# Patient Record
Sex: Male | Born: 1950 | Race: White | Hispanic: No | Marital: Married | Smoking: Never smoker
Health system: Southern US, Community
[De-identification: ages and names within clinical notes are randomized; demographics above are authoritative.]

## PROBLEM LIST (undated history)

## (undated) DIAGNOSIS — E119 Type 2 diabetes mellitus without complications: Secondary | ICD-10-CM

## (undated) DIAGNOSIS — I10 Essential (primary) hypertension: Secondary | ICD-10-CM

## (undated) DIAGNOSIS — E785 Hyperlipidemia, unspecified: Secondary | ICD-10-CM

---

## 2007-05-29 ENCOUNTER — Emergency Department: Payer: Self-pay | Admitting: Emergency Medicine

## 2007-10-11 ENCOUNTER — Ambulatory Visit: Payer: Self-pay | Admitting: Unknown Physician Specialty

## 2008-06-28 DIAGNOSIS — C4492 Squamous cell carcinoma of skin, unspecified: Secondary | ICD-10-CM

## 2008-06-28 DIAGNOSIS — D239 Other benign neoplasm of skin, unspecified: Secondary | ICD-10-CM

## 2008-06-28 HISTORY — DX: Other benign neoplasm of skin, unspecified: D23.9

## 2008-06-28 HISTORY — DX: Squamous cell carcinoma of skin, unspecified: C44.92

## 2020-08-20 ENCOUNTER — Other Ambulatory Visit: Payer: Self-pay | Admitting: Sports Medicine

## 2020-08-20 DIAGNOSIS — M5136 Other intervertebral disc degeneration, lumbar region: Secondary | ICD-10-CM

## 2020-08-20 DIAGNOSIS — G8929 Other chronic pain: Secondary | ICD-10-CM

## 2020-08-20 DIAGNOSIS — M5416 Radiculopathy, lumbar region: Secondary | ICD-10-CM

## 2020-08-20 DIAGNOSIS — M47816 Spondylosis without myelopathy or radiculopathy, lumbar region: Secondary | ICD-10-CM

## 2020-08-20 DIAGNOSIS — M419 Scoliosis, unspecified: Secondary | ICD-10-CM

## 2020-08-20 DIAGNOSIS — R29898 Other symptoms and signs involving the musculoskeletal system: Secondary | ICD-10-CM

## 2020-09-09 ENCOUNTER — Ambulatory Visit
Admission: RE | Admit: 2020-09-09 | Discharge: 2020-09-09 | Disposition: A | Discharge status: Home / Self Care | Payer: Medicare HMO | Source: Ambulatory Visit | Attending: Sports Medicine | Admitting: Sports Medicine

## 2020-09-09 ENCOUNTER — Other Ambulatory Visit: Payer: Self-pay

## 2020-09-09 DIAGNOSIS — R29898 Other symptoms and signs involving the musculoskeletal system: Secondary | ICD-10-CM

## 2020-09-09 DIAGNOSIS — M5416 Radiculopathy, lumbar region: Secondary | ICD-10-CM

## 2020-09-09 DIAGNOSIS — M5136 Other intervertebral disc degeneration, lumbar region: Secondary | ICD-10-CM

## 2020-09-09 DIAGNOSIS — M419 Scoliosis, unspecified: Secondary | ICD-10-CM

## 2020-09-09 DIAGNOSIS — M47816 Spondylosis without myelopathy or radiculopathy, lumbar region: Secondary | ICD-10-CM

## 2020-09-09 DIAGNOSIS — G8929 Other chronic pain: Secondary | ICD-10-CM

## 2022-04-22 IMAGING — MR MR LUMBAR SPINE W/O CM
4 of 5 series · 26 of 48 positions shown · non-contrast
Comparison: None

CLINICAL DATA: Low back pain into the left lower extremity to the
foot. Decreased range of motion in the left lower extremity.

EXAM:
MRI LUMBAR SPINE WITHOUT CONTRAST
TECHNIQUE: Multiplanar, multisequence MR imaging of the lumbar spine was
performed. No intravenous contrast was administered.

[Series 2: T2 · sagittal · 4.0mm · 1.09mm/px · 6 of 17 slices shown (1 of 2)]
[im 1/17]
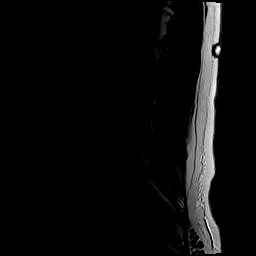
[im 4/17]
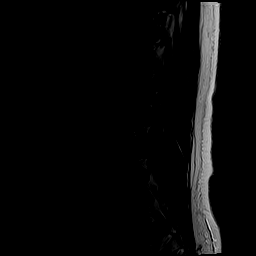
[im 7/17]
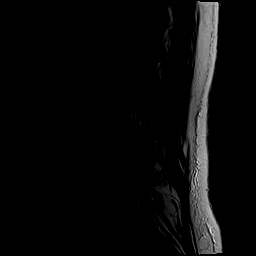
[im 10/17]
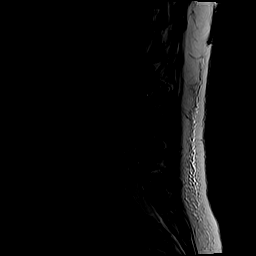
[im 13/17]
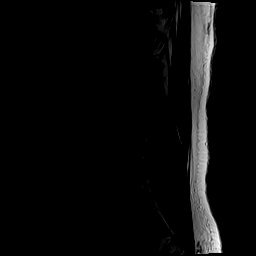
[im 17/17]
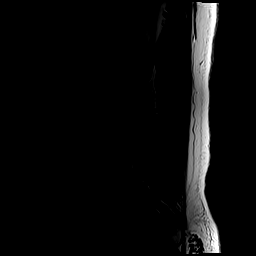

[Series 4: T1 · sagittal · 4.0mm · 1.09mm/px · 6 of 17 slices shown (1 of 2)]
[im 1/17]
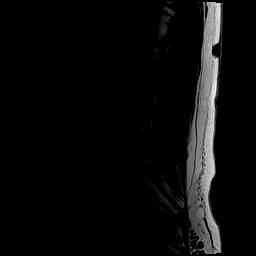
[im 4/17]
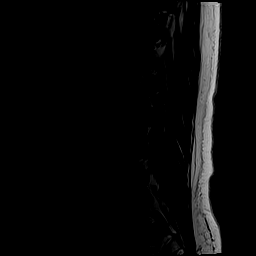
[im 7/17]
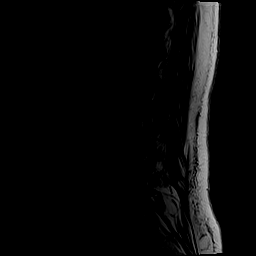
[im 10/17]
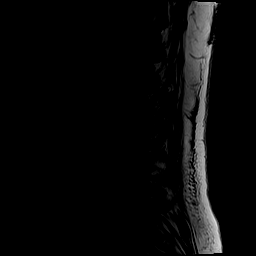
[im 13/17]
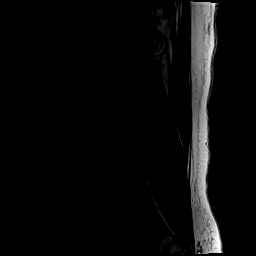
[im 17/17]
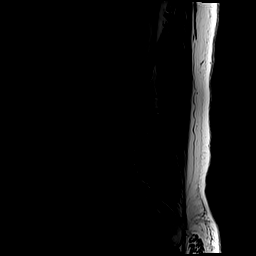

[Series 5: T2 · axial · 4.0mm · 0.39mm/px · z∈[-132,+94]mm · 9 of 42 slices shown (2 of 2)]
[im 1/42]
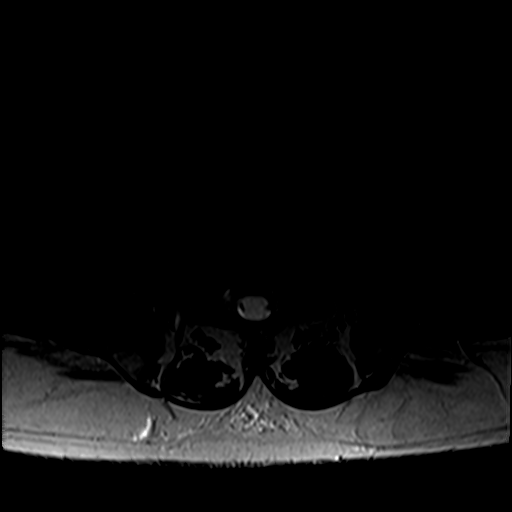
[im 6/42]
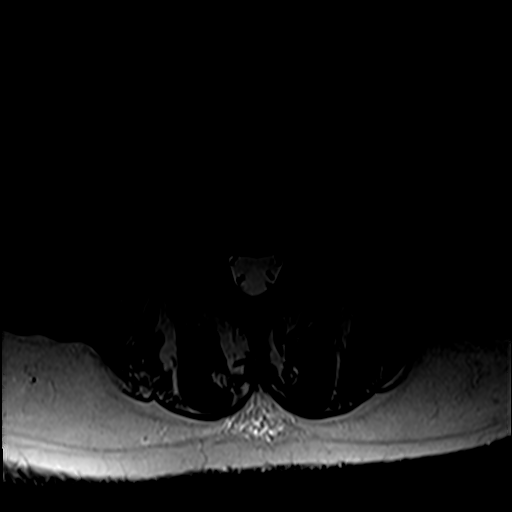
[im 12/42]
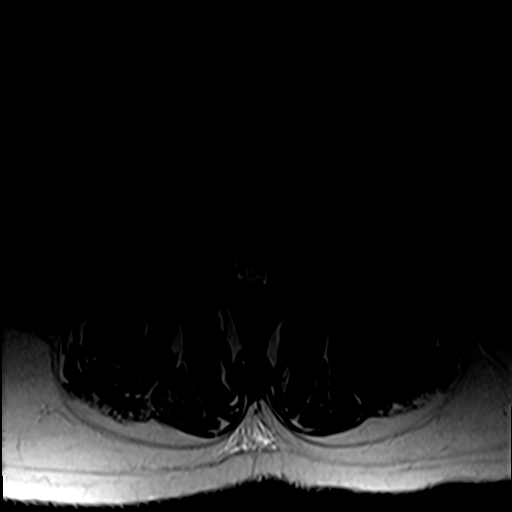
[im 18/42]
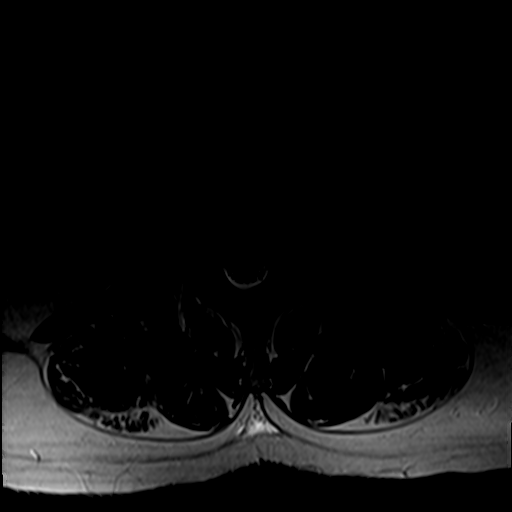
[im 21/42]
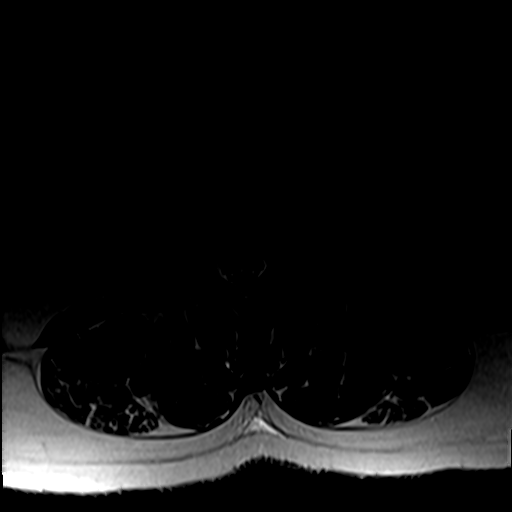
[im 24/42]
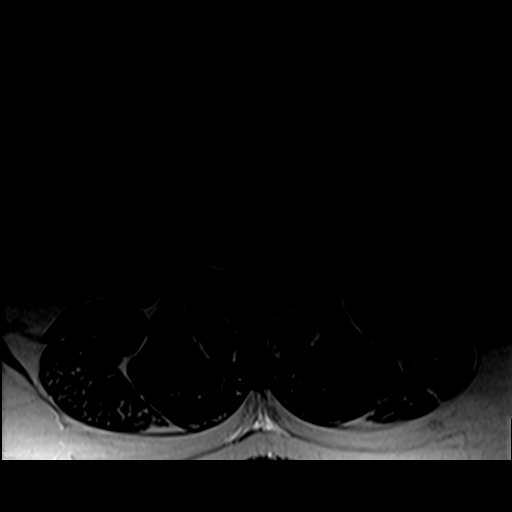
[im 30/42]
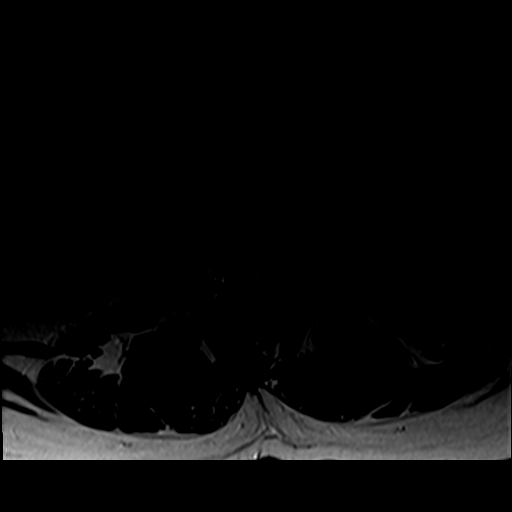
[im 36/42]
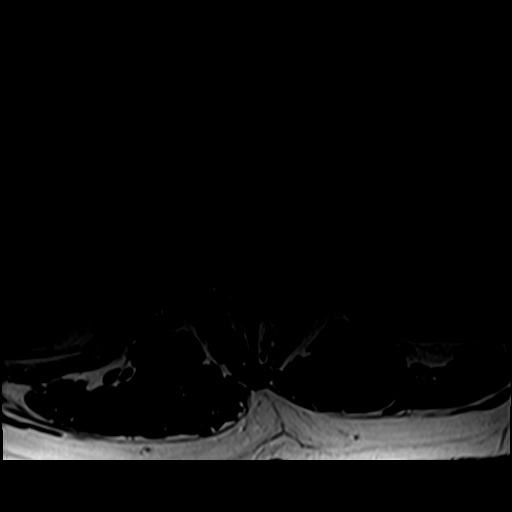
[im 42/42]
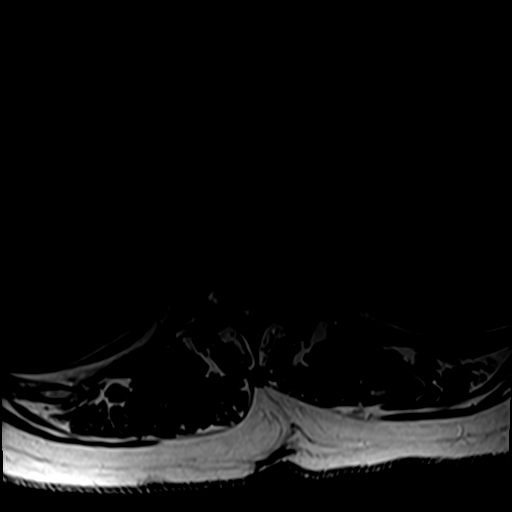

[Series 6: T1 · axial · 4.0mm · 0.39mm/px · z∈[-132,+65]mm · 5 of 42 slices shown (2 of 2)]
[im 1/42]
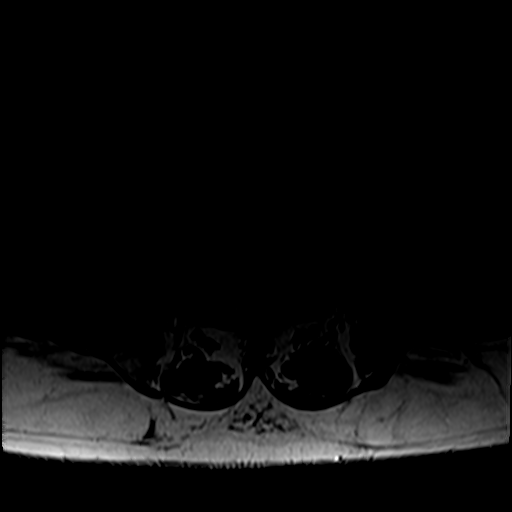
[im 6/42]
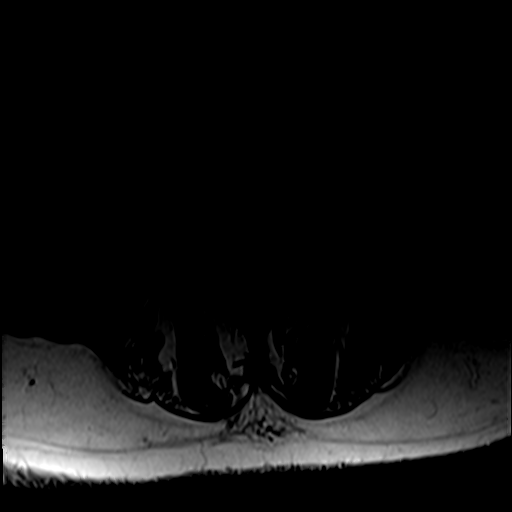
[im 12/42]
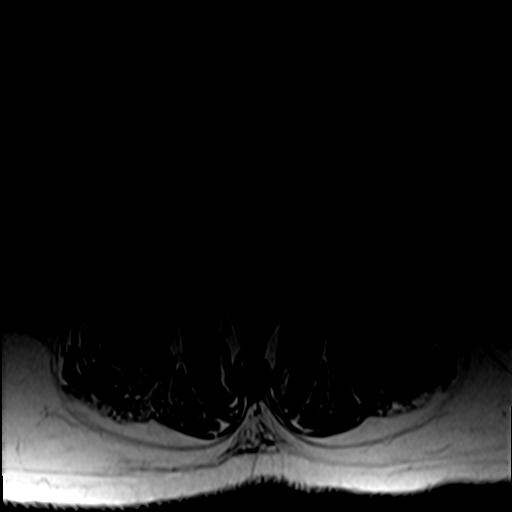
[im 21/42]
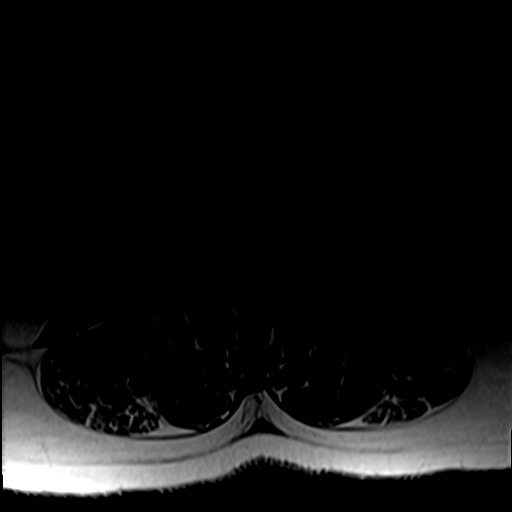
[im 36/42]
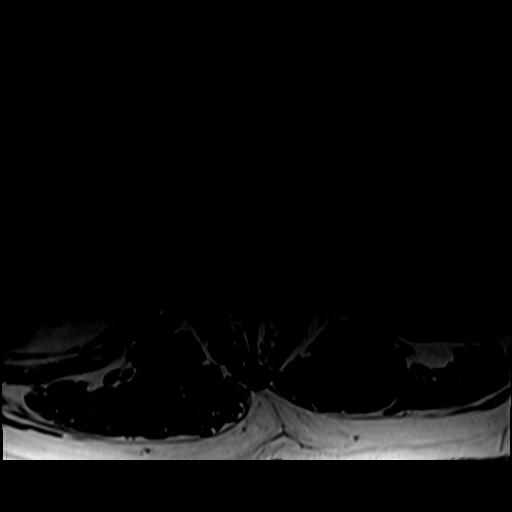

[26 of 48 positions shown; findings below may reference images not displayed]

FINDINGS: Segmentation: 5 non rib-bearing lumbar type vertebral bodies are
present. The lowest fully formed vertebral body is L5.

Alignment: No significant listhesis is present. There is mild
straightening of the normal lumbar lordosis. S shaped curvature of
the lumbar spine is centered to the right at L2-3 and to the left at
L4-5.

Vertebrae: Vertebral body heights are maintained. Chronic fatty
endplate marrow changes are present on the right at L5-S1 and
posteriorly at L3-4 and L4-5.

Conus medullaris and cauda equina: Conus extends to the L1-2 level.
Conus and cauda equina appear normal.

Paraspinal and other soft tissues: Limited imaging the abdomen is
unremarkable. There is no significant adenopathy. No solid organ
lesions are present. Focal subcutaneous lesion to the right of
midline at the L1-2 level likely represents a sebaceous cyst
measuring up to 18 mm.

Disc levels:

T12-L1: Negative.

L1-2: Mild disc bulging is present without significant stenosis.

L2-3: Mild disc bulging is asymmetric to the right. Mild bilateral
facet hypertrophy is present. No significant stenosis is present.

L3-4: A broad-based disc protrusion is present. Mild facet
hypertrophy is noted bilaterally. This results in moderate left and
mild right foraminal narrowing. Mild left subarticular narrowing is
present.

L4-5: A broad-based disc protrusion is present. Moderate facet
hypertrophy is worse on the right. The central canal is patent.
Severe right and moderate left foraminal stenosis is present.

L5-S1: A rightward disc protrusion is present. Disc extends into the
foramen without significant stenosis. Facet hypertrophy is worse on
the right.
IMPRESSION: 1. Most significant left-sided disease is at L3-4 with moderate left
and mild right foraminal stenosis secondary to a broad-based disc
protrusion and facet hypertrophy.
2. Mild left subarticular narrowing at L3-4.
3. Severe right and moderate left foraminal stenosis at L4-5
secondary to a broad-based disc protrusion and moderate facet
hypertrophy.
4. Rightward disc protrusion at L5-S1 without significant stenosis.
5. Mild disc bulging and facet hypertrophy at L1-2 and L2-3 without
significant stenosis.

## 2022-09-13 DIAGNOSIS — I700 Atherosclerosis of aorta: Secondary | ICD-10-CM | POA: Insufficient documentation

## 2022-09-23 ENCOUNTER — Other Ambulatory Visit: Payer: Self-pay

## 2022-09-23 ENCOUNTER — Observation Stay (HOSPITAL_COMMUNITY)
Admission: EM | Admit: 2022-09-23 | Discharge: 2022-09-24 | Disposition: A | Discharge status: Home / Self Care | Payer: Medicare HMO | Attending: Internal Medicine | Admitting: Internal Medicine

## 2022-09-23 ENCOUNTER — Emergency Department (HOSPITAL_COMMUNITY): Payer: Medicare HMO

## 2022-09-23 ENCOUNTER — Encounter (HOSPITAL_COMMUNITY): Payer: Self-pay

## 2022-09-23 DIAGNOSIS — R5383 Other fatigue: Secondary | ICD-10-CM | POA: Diagnosis not present

## 2022-09-23 DIAGNOSIS — R4182 Altered mental status, unspecified: Secondary | ICD-10-CM | POA: Diagnosis not present

## 2022-09-23 DIAGNOSIS — R471 Dysarthria and anarthria: Secondary | ICD-10-CM

## 2022-09-23 DIAGNOSIS — I959 Hypotension, unspecified: Secondary | ICD-10-CM | POA: Diagnosis not present

## 2022-09-23 DIAGNOSIS — R0902 Hypoxemia: Secondary | ICD-10-CM | POA: Insufficient documentation

## 2022-09-23 DIAGNOSIS — R4781 Slurred speech: Secondary | ICD-10-CM | POA: Insufficient documentation

## 2022-09-23 DIAGNOSIS — Z7984 Long term (current) use of oral hypoglycemic drugs: Secondary | ICD-10-CM | POA: Diagnosis not present

## 2022-09-23 DIAGNOSIS — E669 Obesity, unspecified: Secondary | ICD-10-CM | POA: Insufficient documentation

## 2022-09-23 DIAGNOSIS — R944 Abnormal results of kidney function studies: Secondary | ICD-10-CM | POA: Insufficient documentation

## 2022-09-23 DIAGNOSIS — R42 Dizziness and giddiness: Secondary | ICD-10-CM | POA: Diagnosis present

## 2022-09-23 DIAGNOSIS — R410 Disorientation, unspecified: Secondary | ICD-10-CM | POA: Insufficient documentation

## 2022-09-23 DIAGNOSIS — R61 Generalized hyperhidrosis: Secondary | ICD-10-CM | POA: Insufficient documentation

## 2022-09-23 DIAGNOSIS — Z1152 Encounter for screening for COVID-19: Secondary | ICD-10-CM | POA: Insufficient documentation

## 2022-09-23 DIAGNOSIS — R110 Nausea: Secondary | ICD-10-CM | POA: Diagnosis not present

## 2022-09-23 DIAGNOSIS — Z79899 Other long term (current) drug therapy: Secondary | ICD-10-CM | POA: Insufficient documentation

## 2022-09-23 DIAGNOSIS — I10 Essential (primary) hypertension: Secondary | ICD-10-CM | POA: Insufficient documentation

## 2022-09-23 DIAGNOSIS — R7989 Other specified abnormal findings of blood chemistry: Secondary | ICD-10-CM | POA: Diagnosis not present

## 2022-09-23 DIAGNOSIS — E785 Hyperlipidemia, unspecified: Secondary | ICD-10-CM | POA: Insufficient documentation

## 2022-09-23 DIAGNOSIS — E119 Type 2 diabetes mellitus without complications: Secondary | ICD-10-CM | POA: Diagnosis not present

## 2022-09-23 DIAGNOSIS — IMO0002 Reserved for concepts with insufficient information to code with codable children: Secondary | ICD-10-CM | POA: Insufficient documentation

## 2022-09-23 DIAGNOSIS — Z6836 Body mass index (BMI) 36.0-36.9, adult: Secondary | ICD-10-CM | POA: Diagnosis not present

## 2022-09-23 HISTORY — DX: Essential (primary) hypertension: I10

## 2022-09-23 HISTORY — DX: Hyperlipidemia, unspecified: E78.5

## 2022-09-23 HISTORY — DX: Type 2 diabetes mellitus without complications: E11.9

## 2022-09-23 LAB — DIFFERENTIAL
Abs Immature Granulocytes: 0.02 10*3/uL (ref 0.00–0.07)
Basophils Absolute: 0.1 10*3/uL (ref 0.0–0.1)
Basophils Relative: 1 %
Eosinophils Absolute: 0.2 10*3/uL (ref 0.0–0.5)
Eosinophils Relative: 3 %
Immature Granulocytes: 0 %
Lymphocytes Relative: 14 %
Lymphs Abs: 1 10*3/uL (ref 0.7–4.0)
Monocytes Absolute: 0.4 10*3/uL (ref 0.1–1.0)
Monocytes Relative: 6 %
Neutro Abs: 5.5 10*3/uL (ref 1.7–7.7)
Neutrophils Relative %: 76 %

## 2022-09-23 LAB — COMPREHENSIVE METABOLIC PANEL
ALT: 47 U/L — ABNORMAL HIGH (ref 0–44)
AST: 35 U/L (ref 15–41)
Albumin: 3.9 g/dL (ref 3.5–5.0)
Alkaline Phosphatase: 25 U/L — ABNORMAL LOW (ref 38–126)
Anion gap: 13 (ref 5–15)
BUN: 22 mg/dL (ref 8–23)
CO2: 24 mmol/L (ref 22–32)
Calcium: 9.4 mg/dL (ref 8.9–10.3)
Chloride: 103 mmol/L (ref 98–111)
Creatinine, Ser: 1.26 mg/dL — ABNORMAL HIGH (ref 0.61–1.24)
GFR, Estimated: >60 mL/min (ref >60)
Glucose, Bld: 198 mg/dL — ABNORMAL HIGH (ref 70–99)
Potassium: 3.7 mmol/L (ref 3.5–5.1)
Sodium: 140 mmol/L (ref 135–145)
Total Bilirubin: 0.4 mg/dL (ref 0.3–1.2)
Total Protein: 6.2 g/dL — ABNORMAL LOW (ref 6.5–8.1)

## 2022-09-23 LAB — CBC
HCT: 38.4 % — ABNORMAL LOW (ref 39.0–52.0)
Hemoglobin: 12.8 g/dL — ABNORMAL LOW (ref 13.0–17.0)
MCH: 30 pg (ref 26.0–34.0)
MCHC: 33.3 g/dL (ref 30.0–36.0)
MCV: 89.9 fL (ref 80.0–100.0)
Platelets: 281 10*3/uL (ref 150–400)
RBC: 4.27 MIL/uL (ref 4.22–5.81)
RDW: 13.6 % (ref 11.5–15.5)
WBC: 7.3 10*3/uL (ref 4.0–10.5)
nRBC: 0 % (ref 0.0–0.2)

## 2022-09-23 LAB — URINALYSIS, ROUTINE W REFLEX MICROSCOPIC
Bilirubin Urine: NEGATIVE
Glucose, UA: NEGATIVE mg/dL
Hgb urine dipstick: NEGATIVE
Ketones, ur: NEGATIVE mg/dL
Leukocytes,Ua: NEGATIVE
Nitrite: NEGATIVE
Protein, ur: NEGATIVE mg/dL
Specific Gravity, Urine: 1.011 (ref 1.005–1.030)
pH: 6 (ref 5.0–8.0)

## 2022-09-23 LAB — I-STAT CHEM 8, ED
BUN: 23 mg/dL (ref 8–23)
Calcium, Ion: 1.19 mmol/L (ref 1.15–1.40)
Chloride: 103 mmol/L (ref 98–111)
Creatinine, Ser: 1.2 mg/dL (ref 0.61–1.24)
Glucose, Bld: 196 mg/dL — ABNORMAL HIGH (ref 70–99)
HCT: 38 % — ABNORMAL LOW (ref 39.0–52.0)
Hemoglobin: 12.9 g/dL — ABNORMAL LOW (ref 13.0–17.0)
Potassium: 3.7 mmol/L (ref 3.5–5.1)
Sodium: 141 mmol/L (ref 135–145)
TCO2: 28 mmol/L (ref 22–32)

## 2022-09-23 LAB — APTT: aPTT: 23 seconds — ABNORMAL LOW (ref 24–36)

## 2022-09-23 LAB — CORTISOL: Cortisol, Plasma: 11.8 ug/dL

## 2022-09-23 LAB — TROPONIN I (HIGH SENSITIVITY)
Troponin I (High Sensitivity): 4 ng/L (ref <18)
Troponin I (High Sensitivity): 4 ng/L (ref <18)

## 2022-09-23 LAB — ETHANOL: Alcohol, Ethyl (B): <10 mg/dL (ref <10)

## 2022-09-23 LAB — RAPID URINE DRUG SCREEN, HOSP PERFORMED
Amphetamines: NOT DETECTED
Barbiturates: NOT DETECTED
Benzodiazepines: NOT DETECTED
Cocaine: NOT DETECTED
Opiates: NOT DETECTED
Tetrahydrocannabinol: POSITIVE — AB

## 2022-09-23 LAB — PROCALCITONIN: Procalcitonin: <0.1 ng/mL

## 2022-09-23 LAB — MAGNESIUM: Magnesium: 1.8 mg/dL (ref 1.7–2.4)

## 2022-09-23 LAB — GLUCOSE, CAPILLARY: Glucose-Capillary: 116 mg/dL — ABNORMAL HIGH (ref 70–99)

## 2022-09-23 LAB — RESP PANEL BY RT-PCR (RSV, FLU A&B, COVID)  RVPGX2
Influenza A by PCR: NEGATIVE
Influenza B by PCR: NEGATIVE
Resp Syncytial Virus by PCR: NEGATIVE
SARS Coronavirus 2 by RT PCR: NEGATIVE

## 2022-09-23 LAB — LACTIC ACID, PLASMA
Lactic Acid, Venous: 4.2 mmol/L (ref 0.5–1.9)
Lactic Acid, Venous: 2.1 mmol/L (ref 0.5–1.9)

## 2022-09-23 LAB — PROTIME-INR
INR: 1.1 (ref 0.8–1.2)
Prothrombin Time: 14.5 seconds (ref 11.4–15.2)

## 2022-09-23 LAB — CBG MONITORING, ED: Glucose-Capillary: 180 mg/dL — ABNORMAL HIGH (ref 70–99)

## 2022-09-23 LAB — BRAIN NATRIURETIC PEPTIDE: B Natriuretic Peptide: 31.4 pg/mL (ref 0.0–100.0)

## 2022-09-23 MED ORDER — LACTATED RINGERS IV BOLUS (SEPSIS)
50.0000 mL | Freq: Once | INTRAVENOUS | Status: AC
  Administered 2022-09-23: 50 mL via INTRAVENOUS

## 2022-09-23 MED ORDER — ACETAMINOPHEN 325 MG PO TABS: 650.0000 mg | ORAL_TABLET | Freq: Four times a day (QID) | ORAL | Status: DC | PRN

## 2022-09-23 MED ORDER — SODIUM CHLORIDE 0.9% FLUSH
3.0000 mL | Freq: Once | INTRAVENOUS | Status: AC
  Administered 2022-09-23: 3 mL via INTRAVENOUS

## 2022-09-23 MED ORDER — VANCOMYCIN HCL 2000 MG/400ML IV SOLN
2000.0000 mg | Freq: Once | INTRAVENOUS | Status: AC
  Administered 2022-09-23: 2000 mg via INTRAVENOUS
  Filled 2022-09-23: qty 400

## 2022-09-23 MED ORDER — METRONIDAZOLE 500 MG/100ML IV SOLN
500.0000 mg | Freq: Once | INTRAVENOUS | Status: AC
  Administered 2022-09-23: 500 mg via INTRAVENOUS
  Filled 2022-09-23: qty 100

## 2022-09-23 MED ORDER — SODIUM CHLORIDE 0.9% FLUSH
3.0000 mL | Freq: Two times a day (BID) | INTRAVENOUS | Status: DC
  Administered 2022-09-23 – 2022-09-24 (×2): 3 mL via INTRAVENOUS

## 2022-09-23 MED ORDER — LACTATED RINGERS IV BOLUS
1000.0000 mL | Freq: Once | INTRAVENOUS | Status: AC
  Administered 2022-09-23: 1000 mL via INTRAVENOUS

## 2022-09-23 MED ORDER — VANCOMYCIN HCL 1500 MG/300ML IV SOLN: 1500.0000 mg | INTRAVENOUS | Status: DC

## 2022-09-23 MED ORDER — SODIUM CHLORIDE 0.9 % IV SOLN: 2.0000 g | Freq: Three times a day (TID) | INTRAVENOUS | Status: DC

## 2022-09-23 MED ORDER — POLYETHYLENE GLYCOL 3350 17 G PO PACK: 17.0000 g | PACK | Freq: Every day | ORAL | Status: DC | PRN

## 2022-09-23 MED ORDER — INSULIN ASPART 100 UNIT/ML IJ SOLN
0.0000 [IU] | Freq: Three times a day (TID) | INTRAMUSCULAR | Status: DC
  Administered 2022-09-24: 2 [IU] via SUBCUTANEOUS

## 2022-09-23 MED ORDER — PANTOPRAZOLE SODIUM 40 MG PO TBEC
40.0000 mg | DELAYED_RELEASE_TABLET | Freq: Every day | ORAL | Status: DC
  Administered 2022-09-24: 40 mg via ORAL
  Filled 2022-09-23: qty 1

## 2022-09-23 MED ORDER — SODIUM CHLORIDE 0.9 % IV SOLN
2.0000 g | Freq: Once | INTRAVENOUS | Status: AC
  Administered 2022-09-23: 2 g via INTRAVENOUS
  Filled 2022-09-23: qty 12.5

## 2022-09-23 MED ORDER — ENOXAPARIN SODIUM 60 MG/0.6ML IJ SOSY
0.5000 mg/kg | PREFILLED_SYRINGE | INTRAMUSCULAR | Status: DC
  Administered 2022-09-23: 60 mg via SUBCUTANEOUS
  Filled 2022-09-23: qty 0.6

## 2022-09-23 MED ORDER — LACTATED RINGERS IV SOLN: INTRAVENOUS | Status: AC

## 2022-09-23 MED ORDER — ACETAMINOPHEN 650 MG RE SUPP: 650.0000 mg | Freq: Four times a day (QID) | RECTAL | Status: DC | PRN

## 2022-09-23 MED ORDER — ONDANSETRON HCL 4 MG/2ML IJ SOLN
4.0000 mg | Freq: Once | INTRAMUSCULAR | Status: AC
  Administered 2022-09-23: 4 mg via INTRAVENOUS
  Filled 2022-09-23: qty 2

## 2022-09-23 MED ORDER — METRONIDAZOLE 500 MG/100ML IV SOLN: 500.0000 mg | Freq: Two times a day (BID) | INTRAVENOUS | Status: DC

## 2022-09-23 MED ORDER — VANCOMYCIN HCL IN DEXTROSE 1-5 GM/200ML-% IV SOLN: 1000.0000 mg | Freq: Once | INTRAVENOUS | Status: DC

## 2022-09-23 NOTE — Sepsis Progress Note (Signed)
eLink is following this Code Sepsis. °

## 2022-09-23 NOTE — Consult Note (Addendum)
NEURO HOSPITALIST CONSULT NOTE   Requestig physician: Dr. Jeanell Sparrow  Reason for Consult: Acute onset of confusion and slurred speech  History obtained from:  EMS and Chart     HPI:                                                                                                                                          Peter Palmer is an 72 y.o. male with a PMHx of DM, HTN and morbid obesity who presents from work via EMS as a Code Stroke. LKN was 0645 when he got up to go to work. After he drove there, he was seen by coworkers to have a staggering gait. He went inside to the breakroom and sat in a chair. EMS was called and on arrival he was confused and somnolent to lethargic with slurred speech. There was no obvious weakness or facial droop per EMS. His confusion was to the extent that he was unable to unlock his cellphone to call his wife. Vitals per EMS: Initial BP 106/45 followed by SBP of 54 on subsequent reading. RR dropped to 10. Was satting at 86% initially, which improved to 94% on 3L. CBG 209. Rhythm strip showed NSR.   Of note, coworkers saw him vomit once prior to EMS arrival. While EMS was moving him to the ambulance, he had two more episodes of emesis. He was then administered IV Zofran.   On arrival to the ED he is somnolent and diffusely weak, with dysarthric and hypophonic speech. He denies being on any blood thinners. He states that he has no history of stroke or MI. He denies any current headache, CP or SOB. He does endorse a sensation of dizziness described also as being lightheaded.   PMHx HTN DM   No family history on file.           Social History:  has no history on file for tobacco use, alcohol use, and drug use.  Not on File  MEDICATIONS:                                                                                                                     No medications on file States that he does take meds for his HTN and DM  ROS:  As per HPI. Detailed ROS deferred in the context of acuity of presentation.   Wt 118.6 kg    General Examination:                                                                                                       Physical Exam  HEENT-  [place]/AT    Lungs- Respirations unlabored Extremities- No edema  Neurological Examination Mental Status: Somnolent with relatively good attention in that context. Fully oriented to city, state, day, year and month. Speech is dysarthric and sparse, but fluent. Naming intact. Able to follow all commands.  Cranial Nerves: II: Visual fields intact bilaterally.  PERRL.   III,IV, VI: EOMI. No nystagmus.  V: Temp sensation equal bilaterally VII: Smile symmetric VIII: Hearing intact to voice.  IX,X: No hypophonia or hoarseness XI: Symmetric shoulder shrug XII: Midline tongue extension Motor: BUE 5/5 with easy fatiguability BLE 5/5 with easy fatiguability.  Tone and bulk are normal x 4 Sensory: Light touch intact throughout, bilaterally. No extinction to DSS. Deep Tendon Reflexes: 1+ and symmetric throughout Plantars: Mute bilaterally  Cerebellar: No ataxia with FNF bilaterally, but with action tremor symmetrically.  Gait: Unable to assess   NIHSS: 1  Lab Results: Basic Metabolic Panel: No results for input(s): "NA", "K", "CL", "CO2", "GLUCOSE", "BUN", "CREATININE", "CALCIUM", "MG", "PHOS" in the last 168 hours.  CBC: No results for input(s): "WBC", "NEUTROABS", "HGB", "HCT", "MCV", "PLT" in the last 168 hours.  Cardiac Enzymes: No results for input(s): "CKTOTAL", "CKMB", "CKMBINDEX", "TROPONINI" in the last 168 hours.  Lipid Panel: No results for input(s): "CHOL", "TRIG", "HDL", "CHOLHDL", "VLDL", "LDLCALC" in the last 168 hours.  Imaging: No results found.   Assessment: 72 year old male presenting via EMS  with acute onset of  confusion and slurred speech - Exam reveals mild dysarthria in the context of diffuse fatiguability and somnolence.  - NIHSS 1 - CT head: No acute abnormality. Chronic right BG lacunar infarction is noted. - Overall presentation does not militate in favor of stroke. Will need ED work up to determine the etiology for his hypotension, decreased O2 saturations and vomiting.   Recommendations: - Will need ED work up to determine the etiology for his hypotension, decreased O2 saturations and vomiting.  - MRI brain   Electronically signed: Dr. Kerney Elbe 09/23/2022, 9:59 AM

## 2022-09-23 NOTE — Code Documentation (Signed)
Peter Palmer is a 72 yr old male arriving to [facility] via EMS on 09/23/2022 with a PMH of DM HTN and obesity. He is not on any blood thinners. Pt is from work where he was last known well at 0645 walking into work. He is now c/o AMS and dysarthria.    Stroke team at bridge for on pt's arrival. Labs, CBG obtained, airway cleared by EDP. Pt to CT with team. NIHSS 2, for drowsy, and dysarthric. The following imaging was completed: CT head. Per Dr. Cheral Marker, CT is negative for ICH.     Pt to ED room 23 where his workup will continue. Pt hypotensive, and vomiting. Fluids and antiemetic given by EDRN. Pt not a candidate for TNK as low suspicion for stroke. Pt ineligible for mechanical thrombectomy due to no LVO symptoms. Handoff with ED RN D[place] Ar.

## 2022-09-23 NOTE — ED Triage Notes (Signed)
Pt brought in by EMS from work after being called d/t lethargy/not himself/slurred speech. Pt LKN was 0645. Upon EMS arrival pt was hypotensive to 54 systolic, HR [id number], and mildly hypoxic (88%) so placed on 2L [place]. Pt still exhibiting slurred speech and drowsy as well as endorsing dizziness.

## 2022-09-23 NOTE — ED Provider Notes (Signed)
[facility] Provider Note   CSN: [id number] Arrival date & time: 09/23/22  [id number]  An emergency department physician performed an initial assessment on this suspected stroke patient at 91.  History {Add pertinent medical, surgical, social history, OB history to HPI:1} Chief Complaint  Patient presents with  . Code Stroke    Peter Palmer is a 72 y.o. male.  HPI 72 yo male with history of hypertension,hyperlipidemia presents today as code stroke. Patient when he went to work at 645.  Somewhere around 8:00 patient was found walking in confused and slurred speech complaining of lightheadedness.  Patient was hypotensive on EMS arrival systolic blood pressure of 54 heart rate in the 70s to 80s and mildly hypoxic 88%.  He was seen at the [place] code stroke.   He denies fever, chills  Home Medications Prior to Admission medications   Medication Sig Start Date End Date Taking? Authorizing Provider  amLODipine (NORVASC) 10 MG tablet Take 10 mg by mouth daily.   Yes [provider]  Cholecalciferol (VITAMIN D-3 PO) Take 1 capsule by mouth daily.   Yes [provider]  FENOFIBRATE PO Take 1 tablet by mouth at bedtime.   Yes [provider]  gabapentin (NEURONTIN) 300 MG capsule Take 900 mg by mouth at bedtime.   Yes [provider]  metFORMIN HCl (GLUCOPHAGE PO) Take 1 tablet by mouth 2 (two) times daily.   Yes [provider]  MILK THISTLE PO Take 2 tablets by mouth at bedtime.   Yes [provider]  Misc Natural Products (TURMERIC CURCUMIN) CAPS Take 1-2 capsules by mouth See admin instructions. 2 capsules every morning, 1 capsule at bedtime.   Yes [provider]  Omega-3 Fatty Acids (FISH OIL PO) Take 2 capsules by mouth daily.   Yes [provider]  omeprazole (PRILOSEC) 20 MG capsule Take 20 mg by mouth daily.   Yes [provider]  OVER THE COUNTER MEDICATION Take  2 capsules by mouth daily. Beet root supplement   Yes [provider]      Allergies    Lipitor [atorvastatin]    Review of Systems   Review of Systems  Physical Exam Updated Vital Signs BP (!) 140/78   Pulse 70   Temp (!) 96.8 F (36 C)   Resp 17   Ht 1.753 m ('5\' 9"'$ )   Wt 118.6 kg   SpO2 (!) 87% Comment: will place back on nasal cannula  BMI 38.61 kg/m  Physical Exam Vitals and nursing note reviewed.  Constitutional:      General: He is not in acute distress.    Appearance: He is ill-appearing.  HENT:     Head: Normocephalic.     Right Ear: External ear normal.     Left Ear: External ear normal.     Nose: Nose normal.     Mouth/Throat:     Mouth: Mucous membranes are dry.  Eyes:     Extraocular Movements: Extraocular movements intact.     Pupils: Pupils are equal, round, and reactive to light.  Cardiovascular:     Rate and Rhythm: Normal rate and regular rhythm.     Pulses: Normal pulses.  Pulmonary:     Effort: Pulmonary effort is normal.     Breath sounds: Normal breath sounds.  Abdominal:     General: Abdomen is flat. Bowel sounds are normal.     Palpations: Abdomen is soft.  Musculoskeletal:  General: Normal range of motion.     Cervical back: Normal range of motion.  Skin:    General: Skin is warm and dry.     Capillary Refill: Capillary refill takes less than 2 seconds.  Neurological:     General: No focal deficit present.     Mental Status: He is alert.     Cranial Nerves: No cranial nerve deficit.     Motor: No weakness.     Gait: Gait normal.  Psychiatric:        Mood and Affect: Mood normal.    ED Results / Procedures / Treatments   Labs (all labs ordered are listed, but only abnormal results are displayed) Labs Reviewed  APTT - Abnormal; Notable for the following components:      Result Value   aPTT 23 (*)    All other components within normal limits  CBC - Abnormal; Notable for the following components:   Hemoglobin  12.8 (*)    HCT 38.4 (*)    All other components within normal limits  COMPREHENSIVE METABOLIC PANEL - Abnormal; Notable for the following components:   Glucose, Bld 198 (*)    Creatinine, Ser 1.26 (*)    Total Protein 6.2 (*)    ALT 47 (*)    Alkaline Phosphatase 25 (*)    All other components within normal limits  LACTIC ACID, PLASMA - Abnormal; Notable for the following components:   Lactic Acid, Venous 4.2 (*)    All other components within normal limits  CBG MONITORING, ED - Abnormal; Notable for the following components:   Glucose-Capillary 180 (*)    All other components within normal limits  I-STAT CHEM 8, ED - Abnormal; Notable for the following components:   Glucose, Bld 196 (*)    Hemoglobin 12.9 (*)    HCT 38.0 (*)    All other components within normal limits  RESP PANEL BY RT-PCR (RSV, FLU A&B, COVID)  RVPGX2  CULTURE, BLOOD (ROUTINE X 2)  CULTURE, BLOOD (ROUTINE X 2)  PROTIME-INR  DIFFERENTIAL  ETHANOL  RAPID URINE DRUG SCREEN, HOSP PERFORMED  LACTIC ACID, PLASMA  URINALYSIS, ROUTINE W REFLEX MICROSCOPIC  CBG MONITORING, ED    EKG EKG Interpretation  Date/Time:  Tuesday September 23 2022 10:39:30 EST Ventricular Rate:  77 PR Interval:  181 QRS Duration: 114 QT Interval:  407 QTC Calculation: 461 R Axis:   9 Text Interpretation: Sinus rhythm Borderline intraventricular conduction delay Minimal ST elevation, inferior leads No old tracing to compare Confirmed by Pattricia Boss [phone]) on 09/23/2022 11:21:06 AM  Radiology DG Chest Port 1 View  Result Date: 09/23/2022 CLINICAL DATA:  Weakness. EXAM: PORTABLE CHEST 1 VIEW COMPARISON:  None Available. FINDINGS: Low lung volumes accentuate the pulmonary vasculature and cardiomediastinal silhouette. Small left pleural effusion with adjacent left basilar atelectasis. No pneumothorax. IMPRESSION: Small left pleural effusion with adjacent left basilar atelectasis. Electronically Signed   By: Emmit Alexanders M.D.   On:  09/23/2022 11:04   CT HEAD CODE STROKE WO CONTRAST  Result Date: 09/23/2022 CLINICAL DATA:  Code stroke. Speech difficulty. Unable to coordinate movement. EXAM: CT HEAD WITHOUT CONTRAST TECHNIQUE: Contiguous axial images were obtained from the base of the skull through the vertex without intravenous contrast. RADIATION DOSE REDUCTION: This exam was performed according to the departmental dose-optimization program which includes automated exposure control, adjustment of the mA and/or kV according to patient size and/or use of iterative reconstruction technique. COMPARISON:  None Available. FINDINGS: Brain: Chronic appearing,  but technically age indeterminate infarct in the right basal ganglia. No evidence of hemorrhage, hydrocephalus, extra-axial collection or mass lesion/mass effect. Vascular: No hyperdense vessel or unexpected calcification. Skull: Normal. Negative for fracture or focal lesion. Sinuses/Orbits: No middle ear or mastoid effusion. Trace mucosal thickening right frontal sinus. Orbits are unremarkable. Other: None. ASPECTS [facility] Stroke Program Early CT Score): 9 IMPRESSION: Chronic appearing, but technically age indeterminate infarct in the right basal ganglia. No evidence of intracranial hemorrhage. Aspects is 9. Findings were paged to Dr. Cheral Marker on 09/23/22 at 10:14 AM via [facility] paging system. Electronically Signed   By: Marin Roberts M.D.   On: 09/23/2022 10:15    Procedures Procedures  {Document cardiac monitor, telemetry assessment procedure when appropriate:1}  Medications Ordered in ED Medications  lactated ringers infusion ( Intravenous New Bag/Given 09/23/22 1410)  ceFEPIme (MAXIPIME) 2 g in sodium chloride 0.9 % 100 mL IVPB (2 g Intravenous New Bag/Given 09/23/22 1411)  metroNIDAZOLE (FLAGYL) IVPB 500 mg (has no administration in time range)  lactated ringers bolus 50 mL (50 mLs Intravenous New Bag/Given 09/23/22 1408)  vancomycin (VANCOREADY) IVPB 2000 mg/400 mL (has no  administration in time range)  ceFEPIme (MAXIPIME) 2 g in sodium chloride 0.9 % 100 mL IVPB (has no administration in time range)  vancomycin (VANCOREADY) IVPB 1500 mg/300 mL (has no administration in time range)  sodium chloride flush (NS) 0.9 % injection 3 mL (3 mLs Intravenous Given 09/23/22 1019)  ondansetron (ZOFRAN) injection 4 mg (4 mg Intravenous Given 09/23/22 1019)  lactated ringers bolus 1,000 mL (0 mLs Intravenous Stopped 09/23/22 1113)  lactated ringers bolus 1,000 mL (0 mLs Intravenous Stopped 09/23/22 1236)    ED Course/ Medical Decision Making/ A&P   {   Click here for ABCD2, HEART and other calculatorsREFRESH Note before signing :1}                          Medical Decision Making 72 year old male past history of hypertension  Amount and/or Complexity of Data Reviewed Labs: ordered. Radiology: ordered.  Risk Prescription drug management.   72 year old male past medical history sniffer hypertension and type 2 diabetes presents today as code stroke.  However, he in fact was hypotensive.  Code stroke initiated prehospital and seen in conjunction with neurology.  Dr. Cheral Marker, is on-call for neurology.  He has seen and evaluated patient does not think stroke. Patient was initially borderline hypotensive.  On my reevaluation he was hypotensive and blood pressure decreased to 60 systolically.  No obvious source.  He is not having chest pain. EKG without acutely ischemic changes. Patient given 2 L of lactated ringer and present received 1 L prehospital. He has had a total of 3 L at this time and blood pressure increased to 118/54 Lactic acid is elevated at at 4.2. CBC reviewed and interpreted and within normal limits Glucose is elevated at 198 Urine drug screen reviewed and significant for [facility] Patient has clinically significantly improved. He has received broad-spectrum antibiotics here in the ED 1 weakness and hypotension Undifferentiated hypotension includes but is not limited  to infection, anemia, heart failure Patient's evaluation here most consistent with infection given generalized weakness, nonfocal findings, and elevated lactic 2 THC use 3 hyperglycemia with known diabetes {Document critical care time when appropriate:1} {Document review of labs and clinical decision tools ie heart score, Chads2Vasc2 etc:1}  {Document your independent review of radiology images, and any outside records:1} {Document your discussion with family members, caretakers,  and with consultants:1} {Document social determinants of health affecting pt's care:1} {Document your decision making why or why not admission, treatments were needed:1} Final Clinical Impression(s) / ED Diagnoses Final diagnoses:  None    Rx / DC Orders ED Discharge Orders     None

## 2022-09-23 NOTE — ED Notes (Signed)
When patient was sat up in bed and moved around to remove his shirts he became diaphoretic and nauseous, resulting in emesis. MD Ray notified and zofran ordered.

## 2022-09-23 NOTE — ED Notes (Signed)
Pt is feeling a lot better and upon learning he was positive for [facility] and going through his day again he remembered this morning at the gas station he purchased some gummies at the counter for his hip pain (among other food and beverage purchases). Pt does not normally ingest caffeine/tobacco/alcohol/drugs of any kind. Pt reports he has in fact never done marijuana or THC, that this accidental ingestion was a first for him.

## 2022-09-23 NOTE — H&P (Addendum)
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 male with medical history significant of HTN, HLD, DM presenting with AMS, ED.  Patient presented from work today and after his behavior became unusual he developed slurred speech and was acting lethargic.  Last normal was around 6:45 AM.  EMS was called and patient noted to be hypotensive with initial systolic BP around [id number], received a liter and route.  Initial O2 sats 88% improved on 2 L.  Due to his altered mentation and slurred speech on arrival he was seen as a code stroke but given hypotension neurology felt this was less likely but did recommend MRI brain to rule it out.  Patient does report taking 2 Gummies this morning from the counter where he gets his food.  Does not typically do this, told they were for pain.  Denies fevers, chills, chest pain, shortness of breath, abdominal pain, constipation, diarrhea, nausea, vomiting.  While in the ED he did develop an episode of diaphoresis and nausea.  ED Course: Vital signs in ED significant for initial blood pressure in the 70 systolic.  This has improved to the 0000000 to 0000000 systolic after IV fluids.  Lab workup included CMP with creatinine at 1.26 around baseline, glucose 198, protein 6.2, AST is 47, ALT 25.  CBC with hemoglobin of 12.8 from unknown baseline.  PT and INR normal.  PTT mildly elevated at 23.  Lactic acid initially elevated 4.2 but improved to 2.1 on repeat.  Respiratory panel for flu COVID RSV negative.  Tylenol level negative.  UDS positive for THC only.  Urinalysis normal.  Blood cultures pending.  Chest x-ray with small left pleural effusion with basilar atelectasis.  CT head showed chronic appearing but technically age-indeterminate infarct.  Patient received vancomycin, cefepime, Flagyl in the ED as well as  additional 2 L of IV fluids and started on a rate of 150 cc an hour.  Also received a dose of Zofran.  Neurology recommended MRI brain and to investigate etiology of hypertension as above.  Review of Systems: As per HPI otherwise all other systems reviewed and are negative.  Past Medical History:  Diagnosis Date   Diabetes mellitus without complication ([place])    Hyperlipemia    Hypertension     History reviewed. No pertinent surgical history.  Social History  reports that he has never smoked. He has never used smokeless tobacco. He reports that he does not drink alcohol and does not use drugs.  Allergies  Allergen Reactions   Lipitor [Atorvastatin] Other (See Comments)    Myalgias    History reviewed. No pertinent family history.  Prior to Admission medications   Medication Sig Start Date End Date Taking? Authorizing Provider  amLODipine (NORVASC) 10 MG tablet Take 10 mg by mouth daily.   Yes [provider]  Cholecalciferol (VITAMIN D-3 PO) Take 1 capsule by mouth daily.   Yes [provider]  FENOFIBRATE PO Take 1 tablet by mouth at bedtime.   Yes [provider]  gabapentin (NEURONTIN) 300 MG capsule Take 900 mg by mouth at bedtime.   Yes [provider]  metFORMIN HCl (GLUCOPHAGE PO) Take 1 tablet by mouth 2 (two) times daily.   Yes [provider]  MILK THISTLE PO Take 2 tablets by mouth at bedtime.   Yes [provider]  Misc Natural Products (TURMERIC CURCUMIN) CAPS Take 1-2 capsules by mouth See admin instructions. 2 capsules every morning, 1 capsule at bedtime.   Yes [provider]  Omega-3 Fatty Acids (FISH OIL PO) Take 2 capsules by mouth daily.   Yes [provider]  omeprazole (PRILOSEC) 20 MG capsule Take 20 mg by mouth daily.   Yes [provider]  OVER THE COUNTER MEDICATION Take 2 capsules by mouth daily. Beet root supplement   Yes [provider]    Physical Exam: Vitals:    09/23/22 1500 09/23/22 1630 09/23/22 1730 09/23/22 1754  BP: (!) 118/54 108/61 (!) 110/52   Pulse: 73 67 67   Resp: '11 19 17   '$ Temp:    (!) 97.2 F (36.2 C)  TempSrc:    Oral  SpO2: 96% 97% 96%   Weight:      Height:        Physical Exam Constitutional:      General: He is not in acute distress.    Appearance: Normal appearance. He is obese.  HENT:     Head: Normocephalic and atraumatic.     Mouth/Throat:     Mouth: Mucous membranes are moist.     Pharynx: Oropharynx is clear.  Eyes:     Extraocular Movements: Extraocular movements intact.     Pupils: Pupils are equal, round, and reactive to light.  Cardiovascular:     Rate and Rhythm: Normal rate and regular rhythm.     Pulses: Normal pulses.     Heart sounds: Normal heart sounds.  Pulmonary:     Effort: Pulmonary effort is normal. No respiratory distress.     Breath sounds: Normal breath sounds.  Abdominal:     General: Bowel sounds are normal. There is no distension.     Palpations: Abdomen is soft.     Tenderness: There is no abdominal tenderness.  Musculoskeletal:        General: No swelling or deformity.  Skin:    General: Skin is warm and dry.  Neurological:     General: No focal deficit present.     Mental Status: Mental status is at baseline.     Labs on Admission: I have personally reviewed following labs and imaging studies  CBC: Recent Labs  Lab 09/23/22 0954 09/23/22 1001  WBC 7.3  --   NEUTROABS 5.5  --   HGB 12.8* 12.9*  HCT 38.4* 38.0*  MCV 89.9  --   PLT 281  --     Basic Metabolic Panel: Recent Labs  Lab 09/23/22 0954 09/23/22 1001  NA 140 141  K 3.7 3.7  CL 103 103  CO2 24  --   GLUCOSE 198* 196*  BUN 22 23  CREATININE 1.26* 1.20  CALCIUM 9.4  --     GFR: Estimated Creatinine Clearance: 69.7 mL/min (by C-G formula based on SCr of 1.2 mg/dL).  Liver Function Tests: Recent Labs  Lab 09/23/22 0954  AST 35  ALT 47*  ALKPHOS 25*  BILITOT 0.4  PROT 6.2*  ALBUMIN  3.9    Urine analysis:    Component Value Date/Time   COLORURINE YELLOW 09/23/2022 1325   APPEARANCEUR CLEAR 09/23/2022 1325   LABSPEC 1.011 09/23/2022 1325   PHURINE 6.0 09/23/2022 1325   GLUCOSEU NEGATIVE 09/23/2022 1325   HGBUR NEGATIVE 09/23/2022 1325   BILIRUBINUR NEGATIVE 09/23/2022 1325   KETONESUR NEGATIVE 09/23/2022 1325   PROTEINUR NEGATIVE 09/23/2022 1325   NITRITE NEGATIVE 09/23/2022  [place] 09/23/2022 1325    Radiological Exams on Admission: DG Chest Port 1 View  Result Date: 09/23/2022 CLINICAL DATA:  Weakness. EXAM: PORTABLE CHEST 1 VIEW COMPARISON:  None Available. FINDINGS: Low lung volumes accentuate the pulmonary vasculature and cardiomediastinal silhouette. Small left pleural effusion with adjacent left basilar atelectasis. No pneumothorax. IMPRESSION: Small left pleural effusion with adjacent left basilar atelectasis. Electronically Signed   By: Emmit Alexanders M.D.   On: 09/23/2022 11:04   CT HEAD CODE STROKE WO CONTRAST  Result Date: 09/23/2022 CLINICAL DATA:  Code stroke. Speech difficulty. Unable to coordinate movement. EXAM: CT HEAD WITHOUT CONTRAST TECHNIQUE: Contiguous axial images were obtained from the base of the skull through the vertex without intravenous contrast. RADIATION DOSE REDUCTION: This exam was performed according to the departmental dose-optimization program which includes automated exposure control, adjustment of the mA and/or kV according to patient size and/or use of iterative reconstruction technique. COMPARISON:  None Available. 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 No evidence of hemorrhage, hydrocephalus, extra-axial collection or mass lesion/mass effect. Vascular: No hyperdense vessel or unexpected calcification. Skull: Normal. Negative for fracture or focal lesion. Sinuses/Orbits: No middle ear or mastoid effusion. Trace mucosal thickening right frontal  sinus. Orbits are unremarkable. Other: None. 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 No evidence of intracranial hemorrhage. Aspects is 9. Findings were paged to Dr. Cheral Marker on 09/23/22 at 10:14 AM via [facility] paging system. Electronically Signed   By: Marin Roberts M.D.   On: 09/23/2022 10:15    EKG: Independently reviewed.  Sinus rhythm at 79 bpm.  Nonspecific T ST changes.  No prior to compare.  Assessment/Plan Principal Problem:   Hypotension Active Problems:   Creatinine elevation   HLD (hyperlipidemia)   HTN (hypertension)   DM (diabetes mellitus) ([place])   Hypotension Rule out sepsis > Patient presenting with altered mental status and hypotension.  Initially seen as a code stroke but neurology felt like this unlikely but did recommend MRI to rule out neurologic component. > Last normal was around 645 this morning.  Did take Gummies this morning presumably with THC in them given no history of substance use and positive for THC. At work noted to be behaving abnormally with slurred speech and lethargy. > Found to be hypotensive on EMS arrival with initial systolic blood pressure in the 50s and then was in the 70s after liter on arrival to the ED.  This has improved to the 144R-154M systolic after additional 2 L IV fluids. > Unclear etiology.  No leukocytosis but will rule out sepsis patient has been started on broad-spectrum antibiotics.  Lactic acid was initially elevated but this could be due to to any hypertensive etiology. > He did have episode of diaphoresis and nausea while in the ED.  Unclear if this could represent cardiac etiology versus some sort of vasovagal event.  This could have influence his blood pressure. > Will check troponin to evaluate for any cardiac ischemia as well as BNP to evaluate for any heart failure etiology of his acute hypotension. > Creatinine mildly  elevated as below, so some component of dehydration possible. - Monitor in progressive unit - Continue IV fluids - Hold off on further antibiotics for now - Check procalcitonin - Check troponin and BNP - Check Cortisol - Follow-up blood cultures - Trend fever curve and WBC - Trend  renal function and electrolytes - MR brain to rule out neurologic/CVA component   Elevated creatinine > Creatinine elevated to 1.26 Baseline.  Possibly AKI from dehydration as above. - Continue IV fluids - Trend renal function and electrolytes.  Hypertension - Holding home antihypertensives in setting of hypotensive  Hyperlipidemia - Holding home fenofibrate until dose can be confirmed  Diabetes > A1c last year 6.2 - SSI  DVT prophylaxis: Lovenox Code Status:   Full Family Communication:  Updated at bedside Disposition Plan:   Patient is from:  Home  Anticipated DC to:  Home  Anticipated DC date:  1 to 3 days  Anticipated DC barriers: None  Consults called:  Neurology, signed off unless MRI abnormal. Admission status:  Observation, progressive  Severity of Illness: The appropriate patient status for this patient is OBSERVATION. Observation status is judged to be reasonable and necessary in order to provide the required intensity of service to ensure the patient's safety. The patient's presenting symptoms, physical exam findings, and initial radiographic and laboratory data in the context of their medical condition is felt to place them at decreased risk for further clinical deterioration. Furthermore, it is anticipated that the patient will be medically stable for discharge from the hospital within 2 midnights of admission.    Marcelyn Bruins MD [facility]  How to contact the [facility] Attending or Consulting provider [place] or covering provider during after hours [place], for this patient?   Check the care team in [facility] and look for a) attending/consulting [facility] provider listed and b) the  [facility] team listed Log into [email or web address] and use [place] universal password to access. If you do not have the password, please contact the hospital operator. Locate the [facility] provider you are looking for under [facility] and page to a number that you can be directly reached. If you still have difficulty reaching the provider, please page the [facility] (Director on Call) for the Hospitalists listed on amion for assistance.  09/23/2022, 6:13 PM

## 2022-09-23 NOTE — ED Notes (Signed)
ED TO INPATIENT HANDOFF REPORT  ED Nurse Name and Phone #: Ivin Poot Name/Age/Gender Peter Palmer 72 y.o. male Room/Bed: 023C/023C  Code Status   Code Status: Full Code  Home/SNF/Other Home Patient oriented to: self, place, time, and situation Is this baseline? Yes   Triage Complete: Triage complete  Chief Complaint Hypotension [I95.9]  Triage Note Pt brought in by EMS from work after being called d/t lethargy/not himself/slurred speech. Pt LKN was 0645. Upon EMS arrival pt was hypotensive to 54 systolic, HR [id number], and mildly hypoxic (88%) so placed on 2L [place]. Pt still exhibiting slurred speech and drowsy as well as endorsing dizziness.    Allergies Allergies  Allergen Reactions   Lipitor [Atorvastatin] Other (See Comments)    Myalgias    Level of Care/Admitting Diagnosis ED Disposition     ED Disposition  Admit   Condition  --   [place]: [place] [[id number]]  Level of Care: Progressive [102]  Admit to Progressive based on following criteria: MULTISYSTEM THREATS such as stable sepsis, metabolic/electrolyte imbalance with or without encephalopathy that is responding to early treatment.  May place patient in observation at [facility] or [place] if equivalent level of care is available:: No  Covid Evaluation: Confirmed COVID Negative  Diagnosis: Hypotension [id number]  Admitting Physician: Marcelyn Bruins [id number]  Attending Physician: Marcelyn Bruins [id number]          B Medical/Surgery History Past Medical History:  Diagnosis Date   Diabetes mellitus without complication ([place])    Hyperlipemia    Hypertension    History reviewed. No pertinent surgical history.   A IV Location/Drains/Wounds Patient Lines/Drains/Airways Status     Active Line/Drains/Airways     Name Placement date Placement time Site Days   Peripheral IV 09/23/22 18 G Left Antecubital 09/23/22  --  Antecubital  less than 1    Peripheral IV 09/23/22 20 G Left;Posterior Hand 09/23/22  --  Hand  less than 1            Intake/Output Last 24 hours  Intake/Output Summary (Last 24 hours) at 09/23/2022 1724 Last data filed at 09/23/2022 1617 Gross per 24 hour  Intake 2250 ml  Output 750 ml  Net 1500 ml    Labs/Imaging Results for orders placed or performed during the hospital encounter of 09/23/22 (from the past 48 hour(s))  CBG monitoring, ED     Status: Abnormal   Collection Time: 09/23/22  9:54 AM  Result Value Ref Range   Glucose-Capillary 180 (H) 70 - 99 mg/dL    Comment: Glucose reference range applies only to samples taken after fasting for at least 8 hours.  Protime-INR     Status: None   Collection Time: 09/23/22  9:54 AM  Result Value Ref Range   Prothrombin Time 14.5 11.4 - 15.2 seconds   INR 1.1 0.8 - 1.2    Comment: (NOTE) INR goal varies based on device and disease states. Performed at [facility], [place] [address] [place], [place] [address]   APTT     Status: Abnormal   Collection Time: 09/23/22  9:54 AM  Result Value Ref Range   aPTT 23 (L) 24 - 36 seconds    Comment: Performed at [place] [address] [place],  [address]  CBC     Status: Abnormal   Collection Time: 09/23/22  9:54 AM  Result Value Ref Range   WBC 7.3  4.0 - 10.5 K/uL   RBC 4.27 4.22 - 5.81 MIL/uL   Hemoglobin 12.8 (L) 13.0 - 17.0 g/dL   HCT 38.4 (L) 39.0 - 52.0 %   MCV 89.9 80.0 - 100.0 fL   MCH 30.0 26.0 - 34.0 pg   MCHC 33.3 30.0 - 36.0 g/dL   RDW 13.6 11.5 - 15.5 %   Platelets 281 150 - 400 K/uL   nRBC 0.0 0.0 - 0.2 %    Comment: Performed at [facility], [place] [address], [place], [place] [address]  Differential     Status: None   Collection Time: 09/23/22  9:54 AM  Result Value Ref Range   Neutrophils Relative % 76 %   Neutro Abs 5.5 1.7 - 7.7 K/uL   Lymphocytes Relative 14 %   Lymphs Abs 1.0 0.7 - 4.0 K/uL   Monocytes Relative 6 %   Monocytes Absolute 0.4 0.1 - 1.0 K/uL    Eosinophils Relative 3 %   Eosinophils Absolute 0.2 0.0 - 0.5 K/uL   Basophils Relative 1 %   Basophils Absolute 0.1 0.0 - 0.1 K/uL   Immature Granulocytes 0 %   Abs Immature Granulocytes 0.02 0.00 - 0.07 K/uL    Comment: Performed at [facility], [place] [address], [place], [place] [address]  Comprehensive metabolic panel     Status: Abnormal   Collection Time: 09/23/22  9:54 AM  Result Value Ref Range   Sodium 140 135 - 145 mmol/L   Potassium 3.7 3.5 - 5.1 mmol/L   Chloride 103 98 - 111 mmol/L   CO2 24 22 - 32 mmol/L   Glucose, Bld 198 (H) 70 - 99 mg/dL    Comment: Glucose reference range applies only to samples taken after fasting for at least 8 hours.   BUN 22 8 - 23 mg/dL   Creatinine, Ser 1.26 (H) 0.61 - 1.24 mg/dL   Calcium 9.4 8.9 - 10.3 mg/dL   Total Protein 6.2 (L) 6.5 - 8.1 g/dL   Albumin 3.9 3.5 - 5.0 g/dL   AST 35 15 - 41 U/L   ALT 47 (H) 0 - 44 U/L   Alkaline Phosphatase 25 (L) 38 - 126 U/L   Total Bilirubin 0.4 0.3 - 1.2 mg/dL   GFR, Estimated >60 >60 mL/min    Comment: (NOTE) Calculated using the CKD-EPI Creatinine Equation (2021)    Anion gap 13 5 - 15    Comment: Performed at [facility], [place] [address], [place], [place] [address]  Ethanol     Status: None   Collection Time: 09/23/22  9:54 AM  Result Value Ref Range   Alcohol, Ethyl (B) <10 <10 mg/dL    Comment: (NOTE) Lowest detectable limit for serum alcohol is 10 mg/dL.  For medical purposes only. Performed at [facility], [place] [address], [place], [place] [address]   I-stat chem 8, ED     Status: Abnormal   Collection Time: 09/23/22 10:01 AM  Result Value Ref Range   Sodium 141 135 - 145 mmol/L   Potassium 3.7 3.5 - 5.1 mmol/L   Chloride 103 98 - 111 mmol/L   BUN 23 8 - 23 mg/dL   Creatinine, Ser 1.20 0.61 - 1.24 mg/dL   Glucose, Bld 196 (H) 70 - 99 mg/dL    Comment: Glucose reference range applies only to samples taken after fasting for at least 8 hours.   Calcium, Ion  1.19 1.15 - 1.40 mmol/L  TCO2 28 22 - 32 mmol/L   Hemoglobin 12.9 (L) 13.0 - 17.0 g/dL   HCT 38.0 (L) 39.0 - 52.0 %  Rapid urine drug screen (hospital performed)     Status: Abnormal   Collection Time: 09/23/22 10:20 AM  Result Value Ref Range   Opiates NONE DETECTED NONE DETECTED   Cocaine NONE DETECTED NONE DETECTED   Benzodiazepines NONE DETECTED NONE DETECTED   Amphetamines NONE DETECTED NONE DETECTED   Tetrahydrocannabinol POSITIVE (A) NONE DETECTED   Barbiturates NONE DETECTED NONE DETECTED    Comment: (NOTE) DRUG SCREEN FOR MEDICAL PURPOSES ONLY.  IF CONFIRMATION IS NEEDED FOR ANY PURPOSE, NOTIFY LAB WITHIN 5 DAYS.  LOWEST DETECTABLE LIMITS FOR URINE DRUG SCREEN Drug Class                     Cutoff (ng/mL) Amphetamine and metabolites    1000 Barbiturate and metabolites    200 Benzodiazepine                 200 Opiates and metabolites        300 Cocaine and metabolites        300 THC                            50 Performed at [facility], [place] [address], [place], [place] [address]   Resp panel by RT-PCR (RSV, Flu A&B, Covid) Anterior Nasal Swab     Status: None   Collection Time: 09/23/22 10:39 AM   Specimen: Anterior Nasal Swab  Result Value Ref Range   SARS Coronavirus 2 by RT PCR NEGATIVE NEGATIVE   Influenza A by PCR NEGATIVE NEGATIVE   Influenza B by PCR NEGATIVE NEGATIVE    Comment: (NOTE) The Xpert Xpress SARS-CoV-2/FLU/RSV plus assay is intended as an aid in the diagnosis of influenza from Nasopharyngeal swab specimens and should not be used as a sole basis for treatment. Nasal washings and aspirates are unacceptable for Xpert Xpress SARS-CoV-2/FLU/RSV testing.  Fact Sheet for Patients: EntrepreneurPulse.com.au  Fact Sheet for Healthcare Providers: IncredibleEmployment.be  This test is not yet approved or cleared by the [place] FDA and has been authorized for detection and/or diagnosis of SARS-CoV-2  by FDA under an Emergency Use Authorization (EUA). This EUA will remain in effect (meaning this test can be used) for the duration of the COVID-19 declaration under Section 564(b)(1) of the Act, 21 U.S.C. section 360bbb-3(b)(1), unless the authorization is terminated or revoked.     Resp Syncytial Virus by PCR NEGATIVE NEGATIVE    Comment: (NOTE) Fact Sheet for Patients: EntrepreneurPulse.com.au  Fact Sheet for Healthcare Providers: IncredibleEmployment.be  This test is not yet approved or cleared by the [place] FDA and has been authorized for detection and/or diagnosis of SARS-CoV-2 by FDA under an Emergency Use Authorization (EUA). This EUA will remain in effect (meaning this test can be used) for the duration of the COVID-19 declaration under Section 564(b)(1) of the Act, 21 U.S.C. section 360bbb-3(b)(1), unless the authorization is terminated or revoked.  Performed at [facility], [place] [address] [place], [place] [address]   Lactic acid, plasma     Status: Abnormal   Collection Time: 09/23/22 10:54 AM  Result Value Ref Range   Lactic Acid, Venous 4.2 (HH) 0.5 - 1.9 mmol/L    Comment: CRITICAL RESULT CALLED TO, READ BACK BY AND VERIFIED WITH B.Diva Lemberger RN 1212 09/23/22 [place] Performed  at [facility], [place] [address], [place], [place] [address]   Lactic acid, plasma     Status: Abnormal   Collection Time: 09/23/22 12:20 PM  Result Value Ref Range   Lactic Acid, Venous 2.1 (HH) 0.5 - 1.9 mmol/L    Comment: CRITICAL VALUE NOTED. VALUE IS CONSISTENT WITH PREVIOUSLY REPORTED/CALLED VALUE Performed at [facility], [place] [address] [place], [place] [address]   Urinalysis, Routine w reflex microscopic -Urine, Clean Catch     Status: None   Collection Time: 09/23/22  1:25 PM  Result Value Ref Range   Color, Urine YELLOW YELLOW   APPearance CLEAR CLEAR   Specific Gravity, Urine 1.011 1.005 - 1.030   pH 6.0 5.0 -  8.0   Glucose, UA NEGATIVE NEGATIVE mg/dL   Hgb urine dipstick NEGATIVE NEGATIVE   Bilirubin Urine NEGATIVE NEGATIVE   Ketones, ur NEGATIVE NEGATIVE mg/dL   Protein, ur NEGATIVE NEGATIVE mg/dL   Nitrite NEGATIVE NEGATIVE   Leukocytes,Ua NEGATIVE NEGATIVE    Comment: Performed at [place] [address] [place], [place] [address]   DG Chest Port 1 View  Result Date: 09/23/2022 CLINICAL DATA:  Weakness. EXAM: PORTABLE CHEST 1 VIEW COMPARISON:  None Available. FINDINGS: Low lung volumes accentuate the pulmonary vasculature and cardiomediastinal silhouette. Small left pleural effusion with adjacent left basilar atelectasis. No pneumothorax. IMPRESSION: Small left pleural effusion with adjacent left basilar atelectasis. Electronically Signed   By: Emmit Alexanders M.D.   On: 09/23/2022 11:04   CT HEAD CODE STROKE WO CONTRAST  Result Date: 09/23/2022 CLINICAL DATA:  Code stroke. Speech difficulty. Unable to coordinate movement. EXAM: CT HEAD WITHOUT CONTRAST TECHNIQUE: Contiguous axial images were obtained from the base of the skull through the vertex without intravenous contrast. RADIATION DOSE REDUCTION: This exam was performed according to the departmental dose-optimization program which includes automated exposure control, adjustment of the mA and/or kV according to patient size and/or use of iterative reconstruction technique. COMPARISON:  None Available. 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 No evidence of hemorrhage, hydrocephalus, extra-axial collection or mass lesion/mass effect. Vascular: No hyperdense vessel or unexpected calcification. Skull: Normal. Negative for fracture or focal lesion. Sinuses/Orbits: No middle ear or mastoid effusion. Trace mucosal thickening right frontal sinus. Orbits are unremarkable. Other: None. 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 No evidence of intracranial hemorrhage. Aspects is 9. Findings were paged to Dr. Cheral Marker on 09/23/22 at 10:14 AM via [facility] paging system. Electronically Signed   By: Marin Roberts M.D.   On: 09/23/2022 10:15    Pending Labs Unresulted Labs (From admission, onward)     Start     Ordered   09/30/22 0500  Creatinine, serum  (enoxaparin (LOVENOX)    CrCl >/= 30 ml/min)  Weekly,   R     Comments: while on enoxaparin therapy    09/23/22 1631   09/24/22 0500  Comprehensive metabolic panel  Tomorrow morning,   R        09/23/22 1631   09/24/22 0500  CBC  Tomorrow morning,   R        09/23/22 1631   09/24/22 0500  Protime-INR  Tomorrow morning,   R        09/23/22 1631   09/23/22 1641  Cortisol  Once,   R  09/23/22 1640   09/23/22 1632  Procalcitonin  Daily,   R     References:    Procalcitonin Lower Respiratory Tract Infection AND Sepsis Procalcitonin Algorithm   09/23/22 1631   09/23/22 1631  Brain natriuretic peptide  Once,   R        09/23/22 1631   09/23/22 1631  Magnesium  Add-on,   AD        09/23/22 1631   09/23/22 1020  Blood culture (routine x 2)  BLOOD CULTURE X 2,   R (with STAT occurrences)      09/23/22 1019            Vitals/Pain Today's Vitals   09/23/22 1407 09/23/22 1413 09/23/22 1500 09/23/22 1630  BP:  (!) 113/54 (!) 118/54 108/61  Pulse:  72 73 67  Resp:  '14 11 19  '$ Temp:  (!) 97 F (36.1 C)    TempSrc:  Oral    SpO2: (!) 87% 95% 96% 97%  Weight:      Height:      PainSc:        Isolation Precautions No active isolations  Medications Medications  lactated ringers infusion ( Intravenous New Bag/Given 09/23/22 1410)  vancomycin (VANCOREADY) IVPB 2000 mg/400 mL (2,000 mg Intravenous New Bag/Given 09/23/22 1607)  ceFEPIme (MAXIPIME) 2 g in sodium chloride 0.9 % 100 mL IVPB (has no administration in time range)  vancomycin (VANCOREADY) IVPB 1500 mg/300 mL (has no administration in time range)   metroNIDAZOLE (FLAGYL) IVPB 500 mg (has no administration in time range)  pantoprazole (PROTONIX) EC tablet 40 mg (has no administration in time range)  enoxaparin (LOVENOX) injection 60 mg (has no administration in time range)  sodium chloride flush (NS) 0.9 % injection 3 mL (has no administration in time range)  acetaminophen (TYLENOL) tablet 650 mg (has no administration in time range)    Or  acetaminophen (TYLENOL) suppository 650 mg (has no administration in time range)  polyethylene glycol (MIRALAX / GLYCOLAX) packet 17 g (has no administration in time range)  sodium chloride flush (NS) 0.9 % injection 3 mL (3 mLs Intravenous Given 09/23/22 1019)  ondansetron (ZOFRAN) injection 4 mg (4 mg Intravenous Given 09/23/22 1019)  lactated ringers bolus 1,000 mL (0 mLs Intravenous Stopped 09/23/22 1113)  lactated ringers bolus 1,000 mL (0 mLs Intravenous Stopped 09/23/22 1236)  ceFEPIme (MAXIPIME) 2 g in sodium chloride 0.9 % 100 mL IVPB (0 g Intravenous Stopped 09/23/22 1441)  metroNIDAZOLE (FLAGYL) IVPB 500 mg (0 mg Intravenous Stopped 09/23/22 1606)  lactated ringers bolus 50 mL (0 mLs Intravenous Stopped 09/23/22 1507)    Mobility walks     Focused Assessments Neuro Assessment Handoff:  Swallow screen pass? Yes    NIH Stroke Scale  Dizziness Present: No Headache Present: No Interval: Shift assessment Level of Consciousness (1a.)   : Alert, keenly responsive LOC Questions (1b. )   : Answers both questions correctly LOC Commands (1c. )   : Performs both tasks correctly Best Gaze (2. )  : Normal Visual (3. )  : No visual loss Facial Palsy (4. )    : Normal symmetrical movements Motor Arm, Left (5a. )   : No drift Motor Arm, Right (5b. ) : No drift Motor Leg, Left (6a. )  : No drift Motor Leg, Right (6b. ) : No drift Limb Ataxia (7. ): Absent Sensory (8. )  : Normal, no sensory loss Best Language (9. )  : No aphasia Dysarthria (10. ):  Normal Extinction/Inattention (11.)   : No  Abnormality Complete NIHSS TOTAL: 0 Last date known well: 09/23/22 Last time known well: 0645 Neuro Assessment: Exceptions to WDL Neuro Checks:   Initial (09/23/22 1030)  Has TPA been given? No If patient is a Neuro Trauma and patient is going to OR before floor call report to [place] nurse: [phone] or [phone]   R Recommendations: See Admitting Provider Note  Report given to:   Additional Notes:

## 2022-09-23 NOTE — Progress Notes (Signed)
Pharmacy Antibiotic Note  Peter Palmer is a 72 y.o. male admitted on 09/23/2022 with sepsis.  Pharmacy has been consulted for Vancomycin and Cefepime dosing x 7 days.  Plan: Cefepime 2gm IV q8h x 7 days total Vancomycin 2000 mg IV now then 1500 mg IV Q 24 hrs X 7 days total. Goal AUC 400-550. Expected AUC: 505, SCr used: 1.2, Vd coeff 0.5 Will f/u renal function, micro data, and pt's clinical condition Vanc levels prn   Height: '5\' 9"'$  (175.3 cm) Weight: 118.6 kg (261 lb 7.5 oz) IBW/kg (Calculated) : 70.7  Temp (24hrs), Avg:96.8 F (36 C), Min:96.8 F (36 C), Max:96.8 F (36 C)  Recent Labs  Lab 09/23/22 0954 09/23/22 1001 09/23/22 1054  WBC 7.3  --   --   CREATININE 1.26* 1.20  --   LATICACIDVEN  --   --  4.2*    Estimated Creatinine Clearance: 69.7 mL/min (by C-G formula based on SCr of 1.2 mg/dL).    Allergies  Allergen Reactions   Lipitor [Atorvastatin] Other (See Comments)    Myalgias    Antimicrobials this admission: 3/5 Cefepime >> 3/11 3/5 Vanc >> 3/11  Microbiology results: 3/5 BCx:   Thank you for allowing pharmacy to be a part of this patient's care.  Sherlon Handing, PharmD, BCPS Please see amion for complete clinical pharmacist phone list 09/23/2022 1:59 PM

## 2022-09-24 ENCOUNTER — Observation Stay (HOSPITAL_COMMUNITY): Payer: Medicare HMO

## 2022-09-24 DIAGNOSIS — I9589 Other hypotension: Secondary | ICD-10-CM

## 2022-09-24 LAB — GLUCOSE, CAPILLARY
Glucose-Capillary: 145 mg/dL — ABNORMAL HIGH (ref 70–99)
Glucose-Capillary: 111 mg/dL — ABNORMAL HIGH (ref 70–99)

## 2022-09-24 LAB — COMPREHENSIVE METABOLIC PANEL
ALT: 51 U/L — ABNORMAL HIGH (ref 0–44)
AST: 33 U/L (ref 15–41)
Albumin: 4 g/dL (ref 3.5–5.0)
Alkaline Phosphatase: 26 U/L — ABNORMAL LOW (ref 38–126)
Anion gap: 9 (ref 5–15)
BUN: 18 mg/dL (ref 8–23)
CO2: 28 mmol/L (ref 22–32)
Calcium: 9.9 mg/dL (ref 8.9–10.3)
Chloride: 102 mmol/L (ref 98–111)
Creatinine, Ser: 1.06 mg/dL (ref 0.61–1.24)
GFR, Estimated: >60 mL/min (ref >60)
Glucose, Bld: 147 mg/dL — ABNORMAL HIGH (ref 70–99)
Potassium: 4 mmol/L (ref 3.5–5.1)
Sodium: 139 mmol/L (ref 135–145)
Total Bilirubin: 0.3 mg/dL (ref 0.3–1.2)
Total Protein: 6.5 g/dL (ref 6.5–8.1)

## 2022-09-24 LAB — CBC
HCT: 41 % (ref 39.0–52.0)
Hemoglobin: 13.6 g/dL (ref 13.0–17.0)
MCH: 29.5 pg (ref 26.0–34.0)
MCHC: 33.2 g/dL (ref 30.0–36.0)
MCV: 88.9 fL (ref 80.0–100.0)
Platelets: 305 10*3/uL (ref 150–400)
RBC: 4.61 MIL/uL (ref 4.22–5.81)
RDW: 13.7 % (ref 11.5–15.5)
WBC: 6.5 10*3/uL (ref 4.0–10.5)
nRBC: 0 % (ref 0.0–0.2)

## 2022-09-24 LAB — PROCALCITONIN: Procalcitonin: <0.1 ng/mL

## 2022-09-24 LAB — PROTIME-INR
INR: 1.1 (ref 0.8–1.2)
Prothrombin Time: 13.6 seconds (ref 11.4–15.2)

## 2022-09-24 MED ORDER — AMLODIPINE BESYLATE 10 MG PO TABS: 10.0000 mg | ORAL_TABLET | Freq: Every day | ORAL | Status: AC

## 2022-09-24 NOTE — Progress Notes (Signed)
Patient and his wife were given discharge instructions and stated understanding.

## 2022-09-24 NOTE — Plan of Care (Signed)

## 2022-09-24 NOTE — TOC Transition Note (Signed)
Transition of Care [facility]) - CM/SW Discharge Note   Patient Details  Name: Peter Palmer MRN: [id number] Date of Birth: Mar 28, 1951  [facility] CM/SW Contact:  Pollie Friar, RN Phone Number: 09/24/2022, 3:05 PM   Clinical Narrative:    Pt is discharging home with self care. No needs per TOC.   Final next level of care: Home/Self Care Barriers to Discharge: No Barriers Identified   Patient Goals and CMS Choice      Discharge Placement                         Discharge Plan and Services Additional resources added to the After Visit Summary for     Discharge Planning Services: CM Consult                                 Social Determinants of Health (SDOH) Interventions SDOH Screenings   Food Insecurity: No Food Insecurity (09/23/2022)  Housing: Low Risk  (09/23/2022)  Transportation Needs: No Transportation Needs (09/23/2022)  Utilities: Not At Risk (09/23/2022)  Tobacco Use: Low Risk  (09/23/2022)     Readmission Risk Interventions     No data to display

## 2022-09-24 NOTE — TOC Initial Note (Signed)
Transition of Care [facility]) - Initial/Assessment Note    Patient Details  Name: Peter Palmer MRN: [id number] Date of Birth: 27-Nov-1950  [facility]) CM/SW Contact:    Pollie Friar, RN Phone Number: 09/24/2022, 11:16 AM  Clinical Narrative:                 Pt is from home with spouse. They both work. Pt works 30 hours a week and spouse is full time.  Both drive and wife can provide transportation if needed.  Pt denies any issues with home medications. They both oversee his meds. [facility] [address] [facility] [place] [facility] primary [facility].  PCP: [facility] following.  Expected Discharge Plan: Home/Self Care Barriers to Discharge: Continued Medical Work up   Patient Goals and CMS Choice            Expected Discharge Plan and Services   Discharge Planning Services: CM Consult   Living arrangements for the past 2 months: Single Family Home                                      Prior Living Arrangements/Services Living arrangements for the past 2 months: Single Family Home Lives with:: Spouse Patient language and need for interpreter reviewed:: Yes Do you feel safe going back to the place where you live?: Yes        Care giver support system in place?: No (comment) Current home services: DME (shower seat and bars in shower) Criminal Activity/Legal Involvement Pertinent to Current Situation/Hospitalization: No - Comment as needed  Activities of Daily Living Home Assistive Devices/Equipment: None ADL Screening (condition at time of admission) Patient's cognitive ability adequate to safely complete daily activities?: Yes Is the patient deaf or have difficulty hearing?: No Does the patient have difficulty seeing, even when wearing glasses/contacts?: No Does the patient have difficulty concentrating, remembering, or making decisions?: No Patient able to express need for assistance with ADLs?: No Does the patient have difficulty  dressing or bathing?: No Independently performs ADLs?: Yes (appropriate for developmental age) Does the patient have difficulty walking or climbing stairs?: No Weakness of Legs: None Weakness of Arms/Hands: None  Permission Sought/Granted                  Emotional Assessment Appearance:: Appears stated age   Affect (typically observed): Quiet Orientation: : Oriented to Self, Oriented to Place, Oriented to  Time, Oriented to Situation   Psych Involvement: No (comment)  Admission diagnosis:  Hypotension [I95.9] Patient Active Problem List   Diagnosis Date Noted   Hypotension 09/23/2022   Creatinine elevation 09/23/2022   HLD (hyperlipidemia) 09/23/2022   HTN (hypertension) 09/23/2022   DM (diabetes mellitus) ([place]) 09/23/2022   PCP:  [facility]:   [place], [place] [address] Phone: [phone] Fax: [phone]  [id number] [id number]: [phone]: [phone]    Social Determinants of Health (SDOH) Social History: SDOH Screenings   Food Insecurity: No Food Insecurity (09/23/2022)  Housing: Low Risk  (09/23/2022)  Transportation Needs: No Transportation Needs (09/23/2022)  Utilities: Not At Risk (09/23/2022)  Tobacco Use: Low Risk  (09/23/2022)   SDOH Interventions:     Readmission Risk Interventions     No  data to display           

## 2022-09-26 NOTE — Discharge Summary (Signed)
Physician Discharge Summary   Patient: Peter Palmer MRN: [id number] DOB: [birth year]  Admit date:     09/23/2022  Discharge date: 09/24/2022  Discharge Physician: Berle Mull  [facility]P: [facility]  Recommendations at discharge: Follow-up with PCP in 1 week.   [facility] [employer]. Schedule an appointment as soon as possible for a visit in 1 week(s).   Contact information: [address] [place] [address] [phone]                Discharge Diagnoses: Principal Problem:   Hypotension Active Problems:   Creatinine elevation   HLD (hyperlipidemia)   HTN (hypertension)   DM (diabetes mellitus) ([place])  Assessment and Plan  Hypotension Sepsis ruled out. Accidental use of substance Patient presenting with altered mental status and hypotension. Initially seen as a code stroke.  Neurology was consulted. Workup including CT of the head as well as MRI brain negative for any acute stroke. Symptoms totally resolved at the time of my evaluation. Patient brought Gummies from a convenience store morning thinking they will help with his pain. Presumably Gummies were with THC in them given no history of substance use and positive for THC. At work noted to be behaving abnormally with slurred speech and lethargy.  Found to be hypotensive on EMS arrival with initial systolic blood pressure in the 50s and then was in the 70s after liter on arrival to the ED.  This has improved to the [id number] systolic after additional 2 L IV fluids. Blood pressure improved.  Mentation appropriate ambulation improved. Close to baseline. No further workup necessary for now. Outpatient follow-up with PCP recommended.   Elevated creatinine Creatinine elevated to 1.26 Baseline. Possibly AKI from dehydration as above. Treated with IV fluids.   Hypertension Blood pressure is improved.  Would recommend to resume Norvasc tomorrow.    Hyperlipidemia Resuming home regimen.   Type 2 diabetes mellitus, controlled without long-term insulin use without complication 123456 last year 6.2 Resume home regimen at home.  Obesity Body mass index is 36.18 kg/m.  Placing the pt at higher risk of poor outcomes.  Consultants:  none  Procedures performed:  none  DISCHARGE MEDICATION: Allergies as of 09/24/2022       Reactions   Lipitor [atorvastatin] Other (See Comments)   Myalgias        Medication List     TAKE these medications    amLODipine 10 MG tablet Commonly known as: NORVASC Take 1 tablet (10 mg total) by mouth daily.   FENOFIBRATE PO Take 1 tablet by mouth at bedtime.   FISH OIL PO Take 2 capsules by mouth daily.   gabapentin 300 MG capsule Commonly known as: NEURONTIN Take 900 mg by mouth at bedtime.   GLUCOPHAGE PO Take 1 tablet by mouth 2 (two) times daily.   MILK THISTLE PO Take 2 tablets by mouth at bedtime.   omeprazole 20 MG capsule Commonly known as: PRILOSEC Take 20 mg by mouth daily.   OVER THE COUNTER MEDICATION Take 2 capsules by mouth daily. Beet root supplement   Turmeric Curcumin Caps Take 1-2 capsules by mouth See admin instructions. 2 capsules every morning, 1 capsule at bedtime.   VITAMIN D-3 PO Take 1 capsule by mouth daily.       Disposition: Home Diet recommendation: Regular diet  Discharge Exam: Vitals:   09/23/22 2331 09/24/22 0305 09/24/22 0734 09/24/22 1108  BP: (!) 110/35 (!) 160/68 (!) 141/57 Marland Kitchen)  146/68  Pulse: 95 70 65 62  Resp: '18 18 18 18  '$ Temp: 97.7 F (36.5 C) 97.6 F (36.4 C) 97.7 F (36.5 C) 98.2 F (36.8 C)  TempSrc: Oral Oral Oral Oral  SpO2: 96% 95% 97% 96%  Weight:      Height:       General: Appear in no distress; no visible Abnormal Neck Mass Or lumps, Conjunctiva normal Cardiovascular: S1 and S2 Present, no Murmur, Respiratory: good respiratory effort, Bilateral Air entry present and CTA, no Crackles, no wheezes Abdomen: Bowel  Sound present, Non tender  Extremities: no Pedal edema Neurology: alert and oriented to time, place, and person  Filed Weights   09/23/22 1000 09/23/22 1845  Weight: 118.6 kg 111.1 kg   Condition at discharge: stable  The results of significant diagnostics from this hospitalization (including imaging, microbiology, ancillary and laboratory) are listed below for reference.   Imaging Studies: MR BRAIN WO CONTRAST  Result Date: 09/24/2022 CLINICAL DATA:  Stroke, follow up EXAM: MRI HEAD WITHOUT CONTRAST TECHNIQUE: Multiplanar, multiecho pulse sequences of the brain and surrounding structures were obtained without intravenous contrast. COMPARISON:  CT head September 23, 2022. 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 No evidence of acute infarct, acute hemorrhage, mass lesion, midline shift or hydrocephalus. Cerebral atrophy. Vascular: Major arterial flow voids are maintained skull base. Skull and upper cervical spine: Normal marrow signal. Sinuses/Orbits: Clear sinuses.  No acute orbital findings. Other: No sizable mastoid effusions. IMPRESSION: 1. No evidence of acute intracranial abnormality. 2. Remote right corona radiata perforator infarct. Electronically Signed   By: Margaretha Sheffield M.D.   On: 09/24/2022 10:35   DG Chest Port 1 View  Result Date: 09/23/2022 CLINICAL DATA:  Weakness. EXAM: PORTABLE CHEST 1 VIEW COMPARISON:  None Available. FINDINGS: Low lung volumes accentuate the pulmonary vasculature and cardiomediastinal silhouette. Small left pleural effusion with adjacent left basilar atelectasis. No pneumothorax. IMPRESSION: Small left pleural effusion with adjacent left basilar atelectasis. Electronically Signed   By: Emmit Alexanders M.D.   On: 09/23/2022 11:04   CT HEAD CODE STROKE WO CONTRAST  Result Date: 09/23/2022 CLINICAL DATA:  Code stroke. Speech difficulty. Unable to coordinate movement. EXAM: CT HEAD WITHOUT CONTRAST TECHNIQUE: Contiguous axial images  were obtained from the base of the skull through the vertex without intravenous contrast. RADIATION DOSE REDUCTION: This exam was performed according to the departmental dose-optimization program which includes automated exposure control, adjustment of the mA and/or kV according to patient size and/or use of iterative reconstruction technique. COMPARISON:  None Available. 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 No evidence of hemorrhage, hydrocephalus, extra-axial collection or mass lesion/mass effect. Vascular: No hyperdense vessel or unexpected calcification. Skull: Normal. Negative for fracture or focal lesion. Sinuses/Orbits: No middle ear or mastoid effusion. Trace mucosal thickening right frontal sinus. Orbits are unremarkable. Other: None. 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 No evidence of intracranial hemorrhage. Aspects is 9. Findings were paged to Dr. Cheral Marker on 09/23/22 at 10:14 AM via [facility] paging system. Electronically Signed   By: Marin Roberts M.D.   On: 09/23/2022 10:15    Microbiology: Results for orders placed or performed during the hospital encounter of 09/23/22  Blood culture (routine x 2)     Status: None (Preliminary result)   Collection Time: 09/23/22 10:20 AM   Specimen: BLOOD RIGHT HAND  Result  Value Ref Range Status   Specimen Description BLOOD RIGHT HAND  Final   Special Requests   Final    BOTTLES DRAWN AEROBIC AND ANAEROBIC Blood Culture results may not be optimal due to an inadequate volume of blood received in culture bottles   Culture   Final    NO GROWTH 3 DAYS Performed at [facility], [place] [address] [place], [place] [address]    Report Status PENDING  Incomplete  Blood culture (routine x 2)     Status: None (Preliminary result)   Collection Time: 09/23/22 10:25 AM   Specimen:  BLOOD RIGHT FOREARM  Result Value Ref Range Status   Specimen Description BLOOD RIGHT FOREARM  Final   Special Requests   Final    BOTTLES DRAWN AEROBIC AND ANAEROBIC Blood Culture results may not be optimal due to an inadequate volume of blood received in culture bottles   Culture   Final    NO GROWTH 3 DAYS Performed at [facility], [place] [address] [place], [place] [address]    Report Status PENDING  Incomplete  Resp panel by RT-PCR (RSV, Flu A&B, Covid) Anterior Nasal Swab     Status: None   Collection Time: 09/23/22 10:39 AM   Specimen: Anterior Nasal Swab  Result Value Ref Range Status   SARS Coronavirus 2 by RT PCR NEGATIVE NEGATIVE Final   Influenza A by PCR NEGATIVE NEGATIVE Final   Influenza B by PCR NEGATIVE NEGATIVE Final    Comment: (NOTE) The Xpert Xpress SARS-CoV-2/FLU/RSV plus assay is intended as an aid in the diagnosis of influenza from Nasopharyngeal swab specimens and should not be used as a sole basis for treatment. Nasal washings and aspirates are unacceptable for Xpert Xpress SARS-CoV-2/FLU/RSV testing.  Fact Sheet for Patients: EntrepreneurPulse.com.au  Fact Sheet for Healthcare Providers: IncredibleEmployment.be  This test is not yet approved or cleared by the [place] FDA and has been authorized for detection and/or diagnosis of SARS-CoV-2 by FDA under an Emergency Use Authorization (EUA). This EUA will remain in effect (meaning this test can be used) for the duration of the COVID-19 declaration under Section 564(b)(1) of the Act, 21 U.S.C. section 360bbb-3(b)(1), unless the authorization is terminated or revoked.     Resp Syncytial Virus by PCR NEGATIVE NEGATIVE Final    Comment: (NOTE) Fact Sheet for Patients: EntrepreneurPulse.com.au  Fact Sheet for Healthcare Providers: IncredibleEmployment.be  This test is not yet approved or cleared by the [place] FDA  and has been authorized for detection and/or diagnosis of SARS-CoV-2 by FDA under an Emergency Use Authorization (EUA). This EUA will remain in effect (meaning this test can be used) for the duration of the COVID-19 declaration under Section 564(b)(1) of the Act, 21 U.S.C. section 360bbb-3(b)(1), unless the authorization is terminated or revoked.  Performed at [facility], [place] [address], [place], [place] [address]    Labs: CBC: Recent Labs  Lab 09/23/22 0954 09/23/22 1001 09/24/22 0844  WBC 7.3  --  6.5  NEUTROABS 5.5  --   --   HGB 12.8* 12.9* 13.6  HCT 38.4* 38.0* 41.0  MCV 89.9  --  88.9  PLT 281  --  123456   Basic Metabolic Panel: Recent Labs  Lab 09/23/22 0954 09/23/22 1001 09/23/22 1658 09/24/22 0844  NA 140 141  --  139  K 3.7 3.7  --  4.0  CL 103 103  --  102  CO2 24  --   --  28  GLUCOSE 198* 196*  --  147*  BUN 22 23  --  18  CREATININE 1.26* 1.20  --  1.06  CALCIUM 9.4  --   --  9.9  MG  --   --  1.8  --    Liver Function Tests: Recent Labs  Lab 09/23/22 0954 09/24/22 0844  AST 35 33  ALT 47* 51*  ALKPHOS 25* 26*  BILITOT 0.4 0.3  PROT 6.2* 6.5  ALBUMIN 3.9 4.0   CBG: Recent Labs  Lab 09/23/22 0954 09/23/22 2130 09/24/22 0616 09/24/22 1109  GLUCAP 180* 116* 145* 111*    Discharge time spent: greater than 30 minutes.  Signed: Berle Mull, MD [facility] 09/24/2022

## 2022-09-28 LAB — CULTURE, BLOOD (ROUTINE X 2)
Culture: NO GROWTH
Culture: NO GROWTH

## 2022-11-27 DIAGNOSIS — N401 Enlarged prostate with lower urinary tract symptoms: Secondary | ICD-10-CM | POA: Insufficient documentation

## 2022-11-27 DIAGNOSIS — E1142 Type 2 diabetes mellitus with diabetic polyneuropathy: Secondary | ICD-10-CM | POA: Insufficient documentation

## 2023-06-04 ENCOUNTER — Ambulatory Visit: Payer: Medicare HMO | Admitting: Dermatology

## 2023-08-10 ENCOUNTER — Encounter: Payer: Self-pay | Admitting: Dermatology

## 2023-08-10 ENCOUNTER — Ambulatory Visit: Payer: Medicare (Managed Care) | Admitting: Dermatology

## 2023-08-10 DIAGNOSIS — D3617 Benign neoplasm of peripheral nerves and autonomic nervous system of trunk, unspecified: Secondary | ICD-10-CM

## 2023-08-10 DIAGNOSIS — D1801 Hemangioma of skin and subcutaneous tissue: Secondary | ICD-10-CM

## 2023-08-10 DIAGNOSIS — Z85828 Personal history of other malignant neoplasm of skin: Secondary | ICD-10-CM

## 2023-08-10 DIAGNOSIS — D492 Neoplasm of unspecified behavior of bone, soft tissue, and skin: Secondary | ICD-10-CM | POA: Diagnosis not present

## 2023-08-10 DIAGNOSIS — D225 Melanocytic nevi of trunk: Secondary | ICD-10-CM

## 2023-08-10 DIAGNOSIS — L814 Other melanin hyperpigmentation: Secondary | ICD-10-CM | POA: Diagnosis not present

## 2023-08-10 DIAGNOSIS — D229 Melanocytic nevi, unspecified: Secondary | ICD-10-CM

## 2023-08-10 DIAGNOSIS — L821 Other seborrheic keratosis: Secondary | ICD-10-CM | POA: Diagnosis not present

## 2023-08-10 DIAGNOSIS — L905 Scar conditions and fibrosis of skin: Secondary | ICD-10-CM

## 2023-08-10 DIAGNOSIS — M1990 Unspecified osteoarthritis, unspecified site: Secondary | ICD-10-CM | POA: Insufficient documentation

## 2023-08-10 DIAGNOSIS — L918 Other hypertrophic disorders of the skin: Secondary | ICD-10-CM

## 2023-08-10 DIAGNOSIS — L820 Inflamed seborrheic keratosis: Secondary | ICD-10-CM | POA: Diagnosis not present

## 2023-08-10 DIAGNOSIS — L720 Epidermal cyst: Secondary | ICD-10-CM

## 2023-08-10 DIAGNOSIS — L578 Other skin changes due to chronic exposure to nonionizing radiation: Secondary | ICD-10-CM

## 2023-08-10 NOTE — Patient Instructions (Addendum)
Cryotherapy Aftercare  Wash gently with soap and water everyday.   Apply Vaseline Jelly daily until healed.    Wound Care Instructions  Cleanse wound gently with soap and water once a day then pat dry with clean gauze. Apply a thin coat of Petrolatum (petroleum jelly, "Vaseline") over the wound (unless you have an allergy to this). We recommend that you use a new, sterile tube of Vaseline. Do not pick or remove scabs. Do not remove the yellow or white "healing tissue" from the base of the wound.  Cover the wound with fresh, clean, nonstick gauze and secure with paper tape. You may use Band-Aids in place of gauze and tape if the wound is small enough, but would recommend trimming much of the tape off as there is often too much. Sometimes Band-Aids can irritate the skin.  You should call the office for your biopsy report after 1 week if you have not already been contacted.  If you experience any problems, such as abnormal amounts of bleeding, swelling, significant bruising, significant pain, or evidence of infection, please call the office immediately.  FOR ADULT SURGERY PATIENTS: If you need something for pain relief you may take 1 extra strength Tylenol (acetaminophen) AND 2 Ibuprofen (200mg  each) together every 4 hours as needed for pain. (do not take these if you are allergic to them or if you have a reason you should not take them.) Typically, you may only need pain medication for 1 to 3 days.      Recommend daily broad spectrum sunscreen SPF 30+ to sun-exposed areas, reapply every 2 hours as needed. Call for new or changing lesions.  Staying in the shade or wearing long sleeves, sun glasses (UVA+UVB protection) and wide brim hats (4-inch brim around the entire circumference of the hat) are also recommended for sun protection.    Due to recent changes in healthcare laws, you may see results of your pathology and/or laboratory studies on MyChart before the doctors have had a chance to  review them. We understand that in some cases there may be results that are confusing or concerning to you. Please understand that not all results are received at the same time and often the doctors may need to interpret multiple results in order to provide you with the best plan of care or course of treatment. Therefore, we ask that you please give [place] 2 business days to thoroughly review all your results before contacting the office for clarification. Should we see a critical lab result, you will be contacted sooner.   If You Need Anything After Your Visit  If you have any questions or concerns for your doctor, please call our main line at [phone] and press option 4 to reach your doctor's medical assistant. If no one answers, please leave a voicemail as directed and we will return your call as soon as possible. Messages left after 4 pm will be answered the following business day.   You may also send [place] a message via MyChart. We typically respond to MyChart messages within 1-2 business days.  For prescription refills, please ask your pharmacy to contact our office. Our fax number is [phone].  If you have an urgent issue when the clinic is closed that cannot wait until the next business day, you can page your doctor at the number below.    Please note that while we do our best to be available for urgent issues outside of office hours, we are not available 24/7.  If you have an urgent issue and are unable to reach [place], you may choose to seek medical care at your doctor's office, retail clinic, urgent care center, or emergency room.  If you have a medical emergency, please immediately call 911 or go to the emergency department.  Pager Numbers  - Dr. Gwen Pounds: [phone]  - Dr. Roseanne Reno: [phone]  - Dr. Katrinka Blazing: [phone]   In the event of inclement weather, please call our main line at [phone] for an update on the status of any delays or closures.  Dermatology  Medication Tips: Please keep the boxes that topical medications come in in order to help keep track of the instructions about where and how to use these. Pharmacies typically print the medication instructions only on the boxes and not directly on the medication tubes.   If your medication is too expensive, please contact our office at [phone] option 4 or send [place] a message through MyChart.   We are unable to tell what your co-pay for medications will be in advance as this is different depending on your insurance coverage. However, we may be able to find a substitute medication at lower cost or fill out paperwork to get insurance to cover a needed medication.   If a prior authorization is required to get your medication covered by your insurance company, please allow [place] 1-2 business days to complete this process.  Drug prices often vary depending on where the prescription is filled and some pharmacies may offer cheaper prices.  The website www.goodrx.com contains coupons for medications through different pharmacies. The prices here do not account for what the cost may be with help from insurance (it may be cheaper with your insurance), but the website can give you the price if you did not use any insurance.  - You can print the associated coupon and take it with your prescription to the pharmacy.  - You may also stop by our office during regular business hours and pick up a GoodRx coupon card.  - If you need your prescription sent electronically to a different pharmacy, notify our office through [facility] or by phone at [phone] option 4.     Si Usted Necesita Algo Despus de Su Visita  Tambin puede enviarnos un mensaje a travs de Clinical cytogeneticist. Por lo general respondemos a los mensajes de MyChart en el transcurso de 1 a 2 das hbiles.  Para renovar recetas, por favor pida a su farmacia que se ponga en contacto con nuestra oficina. Annie Sable de fax es [place] [phone].  Si  tiene un asunto urgente cuando la clnica est cerrada y que no puede esperar hasta el siguiente da hbil, puede llamar/localizar a su doctor(a) al nmero que aparece a continuacin.   Por favor, tenga en cuenta que aunque hacemos todo lo posible para estar disponibles para asuntos urgentes fuera del horario de [place], no estamos disponibles las 24 horas del da, los 7 [address]  [address] de la [place].   Si tiene un problema urgente y no puede comunicarse con nosotros, puede optar por buscar atencin mdica  en el consultorio de su doctor(a), en una clnica privada, en un centro de atencin urgente o en una sala de emergencias.  Si tiene Engineer, drilling, por favor llame inmediatamente al 911 o vaya a la sala de emergencias.  Nmeros de bper  - Dr. Gwen Pounds: [phone]  - Dra. Roseanne Reno: [phone]  - Dr. Katrinka Blazing: [phone]   En caso de inclemencias del tiempo, por favor llame a Ferne Coe  lnea principal al [phone] para una actualizacin sobre el [place] de cualquier retraso o cierre.  Consejos para la medicacin en dermatologa: Por favor, guarde las cajas en las que vienen los medicamentos de uso tpico para ayudarle a seguir las instrucciones sobre dnde y cmo usarlos. Las farmacias generalmente imprimen las instrucciones del medicamento slo en las cajas y no directamente en los tubos del [place].   Si su medicamento es muy caro, por favor, pngase en contacto con Rolm Gala llamando al [phone] y presione la opcin 4 o envenos un mensaje a travs de Clinical cytogeneticist.   No podemos decirle cul ser su copago por los medicamentos por adelantado ya que esto es diferente dependiendo de la cobertura de su seguro. Sin embargo, es posible que podamos encontrar un medicamento sustituto a Audiological scientist un formulario para que el seguro cubra el medicamento que se considera necesario.   Si se requiere una autorizacin previa para que su compaa de seguros [place] su medicamento, por  favor permtanos de 1 a 2 das hbiles para completar [address]  Los precios de los medicamentos varan con frecuencia dependiendo del Environmental consultant de dnde se surte la receta y alguna farmacias pueden ofrecer precios ms baratos.  El sitio web www.goodrx.com tiene cupones para medicamentos de Health and safety inspector. Los precios aqu no tienen en cuenta lo que podra costar con la ayuda del seguro (puede ser ms barato con su seguro), pero el sitio web puede darle el precio si no utiliz Tourist information centre manager.  - Puede imprimir el cupn correspondiente y llevarlo con su receta a la farmacia.  - Tambin puede pasar por nuestra oficina durante el horario de atencin regular y Education officer, museum una tarjeta de cupones de GoodRx.  - Si necesita que su receta se enve electrnicamente a una farmacia diferente, informe a nuestra oficina a travs de MyChart de [place] o por telfono llamando al [phone] y presione la opcin 4.

## 2023-08-10 NOTE — Progress Notes (Signed)
New Patient Visit   Subjective  Peter Palmer is a 73 y.o. male who presents for the following: Spot on right cheek and left cheek. Spots on back. Spot on left cheek burns at times.   Hx of SCC.   The patient has spots, moles and lesions to be evaluated, some may be new or changing and the patient may have concern these could be cancer.  Wife is with patient and contributes to history.   The following portions of the chart were reviewed this encounter and updated as appropriate: medications, allergies, medical history  Review of Systems:  No other skin or systemic complaints except as noted in HPI or Assessment and Plan.  Objective  Well appearing patient in no apparent distress; mood and affect are within normal limits.  A focused examination was performed of the following areas: Face, back, chest, arms, hands, axillae Relevant physical exam findings are noted in the Assessment and Plan.  right cheek x1 Erythematous keratotic or waxy stuck-on papule or plaque. 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 Excised 08/17/2008. - No evidence of recurrence today - No lymphadenopathy - Recommend regular full body skin exams. Patient prefers as needed follow up - Recommend daily broad spectrum sunscreen SPF 30+ to sun-exposed areas, reapply every 2 hours as needed.  - Call if any new or changing lesions are noted between office visits   SEBORRHEIC KERATOSIS - Stuck-on, waxy, tan-brown papules and/or plaques at back - Benign-appearing - Discussed benign etiology and prognosis. - Observe - Call for any changes  LENTIGINES Exam: scattered tan macules at back Due to sun exposure Treatment Plan: Benign-appearing, observe. Recommend daily broad spectrum sunscreen SPF 30+ to sun-exposed areas, reapply every 2 hours as  needed.  Call for any changes  EPIDERMAL INCLUSION CYST Exam: Subcutaneous nodules at back  Benign-appearing. Exam most consistent with an epidermal inclusion cyst. Discussed that a cyst is a benign growth that can grow over time and sometimes get irritated or inflamed. Recommend observation if it is not bothersome. Discussed option of surgical excision to remove it if it is growing, symptomatic, or other changes noted. Please call for new or changing lesions so they can be evaluated.  Acrochordons (Skin Tags) - Fleshy, skin-colored pedunculated papules at B/L axilla - Benign appearing.  - Observe. - If desired, they can be removed with an in office procedure that is not covered by insurance. - Please call the clinic if you notice any new or changing lesions.   INFLAMED SEBORRHEIC KERATOSIS right cheek x1 Symptomatic, irritating, patient would like treated. Destruction of lesion - right cheek x1 Complexity: simple   Destruction method: cryotherapy   Informed consent: discussed and consent obtained   Timeout:  patient name, date of birth, surgical site, and procedure verified Lesion destroyed using liquid nitrogen: Yes   Region frozen until ice ball extended beyond lesion: Yes   Cryo cycles: 1 or 2. Outcome: patient tolerated procedure well with no complications   Post-procedure details: wound care instructions given   Additional details:  Prior to procedure, discussed risks of blister formation, small wound, skin dyspigmentation, or rare scar following cryotherapy. Recommend Vaseline ointment to treated areas while healing.  NEOPLASM OF SKIN (2) Left Upper Chest Skin / nail biopsy Type of biopsy: tangential  Informed consent: discussed and consent obtained   Timeout: patient name, date of birth, surgical site, and procedure verified   Procedure prep:  Patient was prepped and draped in usual sterile fashion Prep type:  Isopropyl alcohol Anesthesia: the lesion was anesthetized in  a standard fashion   Anesthetic:  1% lidocaine w/ epinephrine 1-100,000 buffered w/ 8.4% NaHCO3 Instrument used: DermaBlade   Hemostasis achieved with: pressure and aluminum chloride   Outcome: patient tolerated procedure well   Post-procedure details: sterile dressing applied and wound care instructions given   Dressing type: bandage and petrolatum   Specimen 1 - Surgical pathology Differential Diagnosis: Halo nevus vs scar vs SCC  Check Margins: No Right Upper Back Skin / nail biopsy Type of biopsy: tangential   Informed consent: discussed and consent obtained   Timeout: patient name, date of birth, surgical site, and procedure verified   Procedure prep:  Patient was prepped and draped in usual sterile fashion Prep type:  Isopropyl alcohol Anesthesia: the lesion was anesthetized in a standard fashion   Anesthetic:  1% lidocaine w/ epinephrine 1-100,000 buffered w/ 8.4% NaHCO3 Instrument used: DermaBlade   Hemostasis achieved with: pressure and aluminum chloride   Outcome: patient tolerated procedure well   Post-procedure details: sterile dressing applied and wound care instructions given   Dressing type: bandage and petrolatum   Specimen 2 - Surgical pathology Differential Diagnosis: Dysplastic nevus vs SK vs melanoma  Check Margins: No MULTIPLE BENIGN NEVI   SEBORRHEIC KERATOSES   LENTIGINES   ACTINIC ELASTOSIS   CHERRY ANGIOMA     Return if symptoms worsen or fail to improve.  I, Lawson Radar, CMA, am acting as scribe for Elie Goody, MD.   Documentation: I have reviewed the above documentation for accuracy and completeness, and I agree with the above.  Elie Goody, MD

## 2023-08-13 LAB — SURGICAL PATHOLOGY

## 2023-08-17 ENCOUNTER — Telehealth: Payer: Self-pay

## 2023-08-17 NOTE — Telephone Encounter (Signed)
Patient advised bx results, scheduled for surgery 10/07/23 Butch Penny., RMA

## 2023-08-17 NOTE — Telephone Encounter (Signed)
-----   Message from [facility] sent at 08/14/2023 11:19 AM EST ----- Diagnosis: 1. Skin, left upper chest :       SUPERFICIAL SCAR OVERLYING NEUROFIBROMA        2. Skin, right upper back :       ATYPICAL COMBINED MELANOCYTIC NEVUS, DEEP MARGIN INVOLVED, SEE DESCRIPTION    Please call to share diagnosis and offer excision.   CHEST: biopsy shows benign overgrowth of nerve and fibrous tissue within a scar. No treatment needed.  RIGHT BACK: biopsy shows an atypical mole. It looks unusual enough that it cannot be distinguished from an early melanoma skin cancer. The safest course of action is to ensure it is fully removed with a surgery.  Treatment: you return for an hour long appointment where we perform a skin surgery. We numb the site of the skin cancer and a safety margin of normal skin around it. We remove the full thickness of skin and close the wound with two layers of stitches. The sample is sent to the lab to check that the skin cancer was fully removed. Return one week later to have wound checked and surface stitches removed. Surgical wound leaves a line scar.

## 2023-10-07 ENCOUNTER — Encounter: Payer: Self-pay | Admitting: Dermatology

## 2023-10-07 ENCOUNTER — Ambulatory Visit: Payer: Medicare (Managed Care) | Admitting: Dermatology

## 2023-10-07 DIAGNOSIS — D229 Melanocytic nevi, unspecified: Secondary | ICD-10-CM

## 2023-10-07 DIAGNOSIS — D225 Melanocytic nevi of trunk: Secondary | ICD-10-CM | POA: Diagnosis not present

## 2023-10-07 MED ORDER — MUPIROCIN 2 % EX OINT: 1.0000 | TOPICAL_OINTMENT | Freq: Every day | CUTANEOUS | 0 refills | Status: AC

## 2023-10-07 NOTE — Progress Notes (Signed)
   Follow-Up Visit   Subjective  Peter Palmer is a 73 y.o. male who presents for the following: Excision of bx proven ATYPICAL COMBINED MELANOCYTIC NEVUS, DEEP MARGIN INVOLVED   The following portions of the chart were reviewed this encounter and updated as appropriate: medications, allergies, medical history  Review of Systems:  No other skin or systemic complaints except as noted in HPI or Assessment and Plan.  Objective  Well appearing patient in no apparent distress; mood and affect are within normal limits.  A focused examination was performed of the following areas: back Relevant physical exam findings are noted in the Assessment and Plan.   Right Upper Back Healing bx site  Assessment & Plan   ATYPICAL NEVUS Right Upper Back Skin excision - Right Upper Back  Lesion length (cm):  0.6 Margin per side (cm):  0.5 Total excision diameter (cm):  1.6 Informed consent: discussed and consent obtained   Timeout: patient name, date of birth, surgical site, and procedure verified   Procedure prep:  Patient was prepped and draped in usual sterile fashion Prep type:  Chlorhexidine Anesthesia: the lesion was anesthetized in a standard fashion   Anesthetic:  1% lidocaine w/ epinephrine 1-100,000 buffered w/ 8.4% NaHCO3 (12 cc) Instrument used: #15 blade   Hemostasis achieved with: pressure   Outcome: patient tolerated procedure well with no complications   Additional details:  Tagged lateral  Skin repair - Right Upper Back Complexity:  Intermediate Final length (cm):  6 Informed consent: discussed and consent obtained   Timeout: patient name, date of birth, surgical site, and procedure verified   Procedure prep:  Patient was prepped and draped in usual sterile fashion Prep type:  Chlorhexidine Anesthesia: the lesion was anesthetized in a standard fashion   Anesthetic:  1% lidocaine w/ epinephrine 1-100,000 buffered w/ 8.4% NaHCO3 Reason for type of repair: reduce tension to  allow closure, reduce the risk of dehiscence, infection, and necrosis, reduce subcutaneous dead space and avoid a hematoma, allow closure of the large defect and preserve normal anatomy   Undermining: edges could be approximated without difficulty   Subcutaneous layers (deep stitches):  Suture size:  3-0 Suture type: Monocryl (poliglecaprone 25)   Stitches:  Buried vertical mattress Fine/surface layer approximation (top stitches):  Suture size:  4-0 Suture type: Prolene (polypropylene)   Stitches: simple running   Suture removal (days):  7 Hemostasis achieved with: suture, pressure and electrodesiccation Outcome: patient tolerated procedure well with no complications   Post-procedure details: sterile dressing applied and wound care instructions given   Dressing type: petrolatum, bandage and pressure dressing   Specimen 1 - Surgical pathology Differential Diagnosis: BX proven ATYPICAL COMBINED MELANOCYTIC NEVUS, DEEP MARGIN INVOLVED  Check Margins: yes [id number] Tagged lateral  Related Medications mupirocin ointment (BACTROBAN) 2 % Apply 1 Application topically daily.   Return in about 1 week (around 10/14/2023) for Suture Removal.  Anise Salvo, RMA, am acting as scribe for Elie Goody, MD .   Documentation: I have reviewed the above documentation for accuracy and completeness, and I agree with the above.  Elie Goody, MD

## 2023-10-07 NOTE — Patient Instructions (Signed)

## 2023-10-09 LAB — SURGICAL PATHOLOGY

## 2023-10-13 ENCOUNTER — Ambulatory Visit: Payer: Medicare (Managed Care) | Admitting: Dermatology

## 2023-10-13 DIAGNOSIS — Z5189 Encounter for other specified aftercare: Secondary | ICD-10-CM

## 2023-10-13 DIAGNOSIS — Z4802 Encounter for removal of sutures: Secondary | ICD-10-CM

## 2023-10-13 DIAGNOSIS — Z48817 Encounter for surgical aftercare following surgery on the skin and subcutaneous tissue: Secondary | ICD-10-CM

## 2023-10-13 NOTE — Progress Notes (Unsigned)
   Follow-Up Visit   Subjective  Peter Palmer is a 73 y.o. male who presents for the following: Suture removal  Pathology showed EXCISION, NO RESIDUAL ATYPICAL COMBINED NEVUS, MARGINS FREE at right upper back  The following portions of the chart were reviewed this encounter and updated as appropriate: medications, allergies, medical history  Review of Systems:  No other skin or systemic complaints except as noted in HPI or Assessment and Plan.  Objective  Well appearing patient in no apparent distress; mood and affect are within normal limits.  Areas Examined: Back  Relevant physical exam findings are noted in the Assessment and Plan.    Assessment & Plan   VISIT FOR WOUND CHECK   ENCOUNTER FOR REMOVAL OF SUTURES   Encounter for Removal of Sutures - Incision site is clean, dry and intact. - Wound cleansed, sutures removed, wound cleansed and steri strips applied.  - Discussed pathology results showing EXCISION, NO RESIDUAL ATYPICAL COMBINED NEVUS, MARGINS FREE at right upper back - Patient advised to keep steri-strips dry until they fall off. - Scars remodel for a full year. - Once steri-strips fall off, patient can apply over-the-counter silicone scar cream once to twice a day to help with scar remodeling if desired. - Patient advised to call with any concerns or if they notice any new or changing lesions.  Return if symptoms worsen or fail to improve, for w/ Dr. Katrinka Blazing.  Wynonia Lawman, CMA, am acting as scribe for Elie Goody, MD .   Documentation: I have reviewed the above documentation for accuracy and completeness, and I agree with the above.  Elie Goody, MD

## 2023-10-13 NOTE — Patient Instructions (Signed)

## 2023-10-14 ENCOUNTER — Encounter: Payer: Self-pay | Admitting: Dermatology

## 2023-11-27 ENCOUNTER — Other Ambulatory Visit: Payer: Self-pay | Admitting: Family Medicine

## 2023-11-27 DIAGNOSIS — S32000A Wedge compression fracture of unspecified lumbar vertebra, initial encounter for closed fracture: Secondary | ICD-10-CM

## 2023-12-01 ENCOUNTER — Ambulatory Visit
Admission: RE | Admit: 2023-12-01 | Discharge: 2023-12-01 | Discharge status: Home / Self Care | Payer: Medicare (Managed Care) | Source: Ambulatory Visit | Attending: Family Medicine | Admitting: Family Medicine

## 2023-12-01 ENCOUNTER — Other Ambulatory Visit (HOSPITAL_COMMUNITY): Payer: Self-pay | Admitting: Interventional Radiology

## 2023-12-01 VITALS — BP 129/61 | HR 65 | Temp 98.4°F | Resp 18 | Wt 250.0 lb

## 2023-12-01 DIAGNOSIS — S32000A Wedge compression fracture of unspecified lumbar vertebra, initial encounter for closed fracture: Secondary | ICD-10-CM

## 2023-12-01 DIAGNOSIS — E119 Type 2 diabetes mellitus without complications: Secondary | ICD-10-CM

## 2023-12-01 DIAGNOSIS — I10 Essential (primary) hypertension: Secondary | ICD-10-CM

## 2023-12-01 HISTORY — PX: IR RADIOLOGIST EVAL & MGMT: IMG5224
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
## 2023-12-02 ENCOUNTER — Other Ambulatory Visit: Payer: Self-pay | Admitting: Interventional Radiology

## 2023-12-02 DIAGNOSIS — M8008XA Age-related osteoporosis with current pathological fracture, vertebra(e), initial encounter for fracture: Secondary | ICD-10-CM

## 2023-12-02 LAB — CBC WITH DIFFERENTIAL/PLATELET
Absolute Lymphocytes: 1411 {cells}/uL (ref 850–3900)
Absolute Monocytes: 375 {cells}/uL (ref 200–950)
Basophils Absolute: 78 {cells}/uL (ref 0–200)
Basophils Relative: 1.4 %
Eosinophils Absolute: 336 {cells}/uL (ref 15–500)
Eosinophils Relative: 6 %
HCT: 42.4 % (ref 38.5–50.0)
Hemoglobin: 13.8 g/dL (ref 13.2–17.1)
MCH: 28.6 pg (ref 27.0–33.0)
MCHC: 32.5 g/dL (ref 32.0–36.0)
MCV: 88 fL (ref 80.0–100.0)
MPV: 11.4 fL (ref 7.5–12.5)
Monocytes Relative: 6.7 %
Neutro Abs: 3399 {cells}/uL (ref 1500–7800)
Neutrophils Relative %: 60.7 %
Platelets: 274 10*3/uL (ref 140–400)
RBC: 4.82 10*6/uL (ref 4.20–5.80)
RDW: 13 % (ref 11.0–15.0)
Total Lymphocyte: 25.2 %
WBC: 5.6 10*3/uL (ref 3.8–10.8)

## 2023-12-02 LAB — COMPLETE METABOLIC PANEL WITHOUT GFR
AG Ratio: 2.1 (calc) (ref 1.0–2.5)
ALT: 40 U/L (ref 9–46)
AST: 26 U/L (ref 10–35)
Albumin: 4.7 g/dL (ref 3.6–5.1)
Alkaline phosphatase (APISO): 53 U/L (ref 35–144)
BUN: 18 mg/dL (ref 7–25)
CO2: 28 mmol/L (ref 20–32)
Calcium: 10.1 mg/dL (ref 8.6–10.3)
Chloride: 99 mmol/L (ref 98–110)
Creat: 0.96 mg/dL (ref 0.70–1.28)
Globulin: 2.2 g/dL (ref 1.9–3.7)
Glucose, Bld: 105 mg/dL — ABNORMAL HIGH (ref 65–99)
Potassium: 4.5 mmol/L (ref 3.5–5.3)
Sodium: 138 mmol/L (ref 135–146)
Total Bilirubin: 0.4 mg/dL (ref 0.2–1.2)
Total Protein: 6.9 g/dL (ref 6.1–8.1)

## 2023-12-02 LAB — CP4508-PT/INR AND PTT
INR: 1.1
Prothrombin Time: 12.2 s — ABNORMAL HIGH (ref 9.0–11.5)
aPTT: 28 s (ref 23–32)

## 2023-12-10 ENCOUNTER — Other Ambulatory Visit: Payer: Self-pay | Admitting: Interventional Radiology

## 2023-12-10 ENCOUNTER — Other Ambulatory Visit: Payer: Self-pay

## 2023-12-10 DIAGNOSIS — M8008XA Age-related osteoporosis with current pathological fracture, vertebra(e), initial encounter for fracture: Secondary | ICD-10-CM

## 2023-12-16 ENCOUNTER — Telehealth: Payer: Self-pay

## 2023-12-16 NOTE — Discharge Instructions (Signed)
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
## 2023-12-17 ENCOUNTER — Ambulatory Visit
Admission: RE | Admit: 2023-12-17 | Discharge: 2023-12-17 | Discharge status: Home / Self Care | Payer: Medicare (Managed Care) | Source: Ambulatory Visit | Attending: Interventional Radiology | Admitting: Interventional Radiology

## 2023-12-17 DIAGNOSIS — M8008XA Age-related osteoporosis with current pathological fracture, vertebra(e), initial encounter for fracture: Secondary | ICD-10-CM

## 2023-12-17 HISTORY — PX: IR KYPHO LUMBAR INC FX REDUCE BONE BX UNI/BIL CANNULATION INC/IMAGING: IMG5519

## 2023-12-17 MED ORDER — MIDAZOLAM HCL 2 MG/2ML IJ SOLN
INTRAMUSCULAR | Status: DC | PRN
  Administered 2023-12-17: .5 mg via INTRAVENOUS
  Administered 2023-12-17: 1 mg via INTRAVENOUS
  Administered 2023-12-17: .5 mg via INTRAVENOUS

## 2023-12-17 MED ORDER — MIDAZOLAM HCL 2 MG/2ML IJ SOLN: 1.0000 mg | INTRAMUSCULAR | Status: DC | PRN

## 2023-12-17 MED ORDER — SODIUM CHLORIDE 0.9 % IV SOLN
INTRAVENOUS | Status: DC
  Administered 2023-12-17: 250 mL via INTRAVENOUS

## 2023-12-17 MED ORDER — LIDOCAINE HCL 1 % IJ SOLN
20.0000 mL | Freq: Once | INTRAMUSCULAR | Status: AC
  Administered 2023-12-17: 20 mL via INTRADERMAL

## 2023-12-17 MED ORDER — ACETAMINOPHEN 10 MG/ML IV SOLN
1000.0000 mg | Freq: Once | INTRAVENOUS | Status: AC
  Administered 2023-12-17: 1000 mg via INTRAVENOUS

## 2023-12-17 MED ORDER — FENTANYL CITRATE PF 50 MCG/ML IJ SOSY: 25.0000 ug | PREFILLED_SYRINGE | INTRAMUSCULAR | Status: DC | PRN

## 2023-12-17 MED ORDER — CEFAZOLIN SODIUM-DEXTROSE 2-4 GM/100ML-% IV SOLN
2.0000 g | INTRAVENOUS | Status: AC
  Administered 2023-12-17: 2 g via INTRAVENOUS

## 2023-12-17 MED ORDER — FENTANYL CITRATE (PF) 100 MCG/2ML IJ SOLN
INTRAMUSCULAR | Status: DC | PRN
  Administered 2023-12-17: 50 ug via INTRAVENOUS
  Administered 2023-12-17 (×2): 25 ug via INTRAVENOUS

## 2023-12-17 NOTE — Progress Notes (Signed)
Pt back in nursing recovery area. Pt still drowsy from procedure but will wake up when spoken to. Pt follows commands, talks in complete sentences and has no complaints at this time. Pt will remain in nursing station until discharge.  ?

## 2023-12-18 ENCOUNTER — Telehealth: Payer: Self-pay

## 2023-12-24 ENCOUNTER — Telehealth: Payer: Self-pay

## 2023-12-24 ENCOUNTER — Other Ambulatory Visit: Payer: Self-pay | Admitting: Interventional Radiology

## 2023-12-24 DIAGNOSIS — S32000A Wedge compression fracture of unspecified lumbar vertebra, initial encounter for closed fracture: Secondary | ICD-10-CM

## 2023-12-31 ENCOUNTER — Ambulatory Visit
Admission: RE | Admit: 2023-12-31 | Discharge: 2023-12-31 | Disposition: A | Discharge status: Home / Self Care | Source: Ambulatory Visit | Attending: Interventional Radiology | Admitting: Interventional Radiology

## 2023-12-31 DIAGNOSIS — S32000A Wedge compression fracture of unspecified lumbar vertebra, initial encounter for closed fracture: Secondary | ICD-10-CM

## 2023-12-31 HISTORY — PX: IR RADIOLOGIST EVAL & MGMT: IMG5224
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
## 2024-01-01 DIAGNOSIS — H93A2 Pulsatile tinnitus, left ear: Secondary | ICD-10-CM | POA: Insufficient documentation

## 2024-02-18 ENCOUNTER — Ambulatory Visit: Payer: Medicare (Managed Care) | Admitting: Urology

## 2024-02-18 ENCOUNTER — Encounter: Payer: Self-pay | Admitting: Urology

## 2024-02-18 VITALS — BP 123/71 | HR 71 | Ht 69.0 in

## 2024-02-18 DIAGNOSIS — N529 Male erectile dysfunction, unspecified: Secondary | ICD-10-CM | POA: Diagnosis not present

## 2024-02-18 DIAGNOSIS — N401 Enlarged prostate with lower urinary tract symptoms: Secondary | ICD-10-CM | POA: Diagnosis not present

## 2024-02-18 DIAGNOSIS — K219 Gastro-esophageal reflux disease without esophagitis: Secondary | ICD-10-CM | POA: Insufficient documentation

## 2024-02-18 DIAGNOSIS — N5201 Erectile dysfunction due to arterial insufficiency: Secondary | ICD-10-CM | POA: Insufficient documentation

## 2024-02-18 DIAGNOSIS — M542 Cervicalgia: Secondary | ICD-10-CM | POA: Insufficient documentation

## 2024-02-18 DIAGNOSIS — H903 Sensorineural hearing loss, bilateral: Secondary | ICD-10-CM | POA: Insufficient documentation

## 2024-02-18 DIAGNOSIS — L84 Corns and callosities: Secondary | ICD-10-CM | POA: Insufficient documentation

## 2024-02-18 DIAGNOSIS — I428 Other cardiomyopathies: Secondary | ICD-10-CM | POA: Insufficient documentation

## 2024-02-18 DIAGNOSIS — N3281 Overactive bladder: Secondary | ICD-10-CM

## 2024-02-18 DIAGNOSIS — E559 Vitamin D deficiency, unspecified: Secondary | ICD-10-CM | POA: Insufficient documentation

## 2024-02-18 DIAGNOSIS — M5442 Lumbago with sciatica, left side: Secondary | ICD-10-CM | POA: Insufficient documentation

## 2024-02-18 DIAGNOSIS — Z6838 Body mass index (BMI) 38.0-38.9, adult: Secondary | ICD-10-CM | POA: Insufficient documentation

## 2024-02-18 LAB — BLADDER SCAN AMB NON-IMAGING: Scan Result: 101

## 2024-02-18 MED ORDER — GEMTESA 75 MG PO TABS: 75.0000 mg | ORAL_TABLET | Freq: Every day | ORAL | Status: DC

## 2024-02-18 NOTE — Patient Instructions (Signed)

## 2024-02-18 NOTE — Progress Notes (Signed)
   02/18/24 2:06 PM   Peter Palmer 1951-05-14 [id number]  CC: Urinary frequency, nocturia  HPI: Relatively frail-appearing 73 year old male referred from IR for urinary symptoms.  He reports at least a few years of bothersome urinary symptoms with frequency and urgency during the day, occasional urge incontinence, and bothersome nocturia every hour overnight.  He is here with his wife today who provides most of the history.  He has been on Flomax for at least 6 months but denies any improvement on that medication.  Reportedly had a UTI about a month ago with dysuria and gross hematuria that resolved with antibiotics, but those records are not available to me.  No cross-sectional imaging to review.  He has never been evaluated for sleep apnea.  He was unable to void for urinalysis today, but PVR normal at 0ml.  They did not fill out the IPSS.  He also reports long-term problems with erections.  He drinks a fair amount of fluid during the day and in the evening.  PMH: Past Medical History:  Diagnosis Date   Diabetes mellitus without complication (HCC)    Dysplastic nevus 06/28/2008   Superior back. Slight to moderate atypia.   Dysplastic nevus 06/28/2008   Mid back. Mild atypia   Dysplastic nevus 08/10/2023   right upper back, severe, excision 10/07/23, no residual DN, margins free.   Hyperlipemia    Hypertension    Squamous cell carcinoma of skin 06/28/2008   Sternal notch. KA pattern. Excised 08/17/2008    Surgical History: Past Surgical History:  Procedure Laterality Date   IR KYPHO LUMBAR INC FX REDUCE BONE BX UNI/BIL CANNULATION INC/IMAGING  12/17/2023   IR RADIOLOGIST EVAL & MGMT  12/01/2023   IR RADIOLOGIST EVAL & MGMT  12/31/2023    Family History: No family history on file.  Social History:  reports that he has never smoked. He has never used smokeless tobacco. He reports that he does not drink alcohol and does not use drugs.  Physical Exam: Ht 5' 9 (1.753 m)   BMI  36.92 kg/m    Constitutional: Frail-appearing, obese Cardiovascular: No clubbing, cyanosis, or edema. Respiratory: Normal respiratory effort, no increased work of breathing. GI: Abdomen is soft, nontender, nondistended, no abdominal masses   Laboratory Data: Reviewed   Assessment & Plan:   Frail-appearing 73 year old male with at least a few years of bothersome urinary symptoms with frequency, urgency, nocturia every hour overnight, no significant improvement on Flomax previously.  Unable to give urinalysis today and bladder scan empty at 0 mL.  Reported prior UTI.  We discussed possible etiologies including BPH, OAB, stricture/obstruction, less likely malignancy.  Options reviewed extensively, and he opted for trial of Gemtesa .  Behavioral strategies were discussed including minimizing fluids prior to bedtime.  He also reports many years of ED, he does not want to take Cialis or Viagra because his friend had a heart attack on this medication.  Need to have realistic expectations if he is unwilling to try PDE 5 inhibitors.  Trial of Gemtesa , samples given, avoid anticholinergics with his age and frailty RTC 6 weeks PVR and symptom check, consider cystoscopy/TRUS if no improvement with Gemtesa    Redell Burnet, MD 02/18/2024  [facility] [address] [place], [place] [address] [phone]

## 2024-03-28 ENCOUNTER — Ambulatory Visit: Payer: Medicare (Managed Care) | Admitting: Physician Assistant

## 2024-03-28 ENCOUNTER — Other Ambulatory Visit: Payer: Self-pay | Admitting: Physician Assistant

## 2024-03-28 ENCOUNTER — Encounter: Payer: Self-pay | Admitting: Physician Assistant

## 2024-03-28 VITALS — BP 119/75 | HR 62 | Ht 69.0 in | Wt 240.0 lb

## 2024-03-28 DIAGNOSIS — R350 Frequency of micturition: Secondary | ICD-10-CM

## 2024-03-28 LAB — BLADDER SCAN AMB NON-IMAGING

## 2024-03-28 MED ORDER — GEMTESA 75 MG PO TABS: 75.0000 mg | ORAL_TABLET | Freq: Every day | ORAL | 3 refills | Status: DC

## 2024-03-28 NOTE — Progress Notes (Signed)
 03/28/2024 3:10 PM   Peter Palmer 09-Feb-1951 [id number]  CC: Chief Complaint  Patient presents with   Urinary Frequency   HPI: Peter Palmer is a 73 y.o. male with PMH chronic LUTS including urgency, frequency, nocturia, and urge incontinence who presents today for symptom recheck on Gemtesa .   Today he reports significant improvement in his voiding symptoms on Gemtesa .  He describes nocturia x 2-3, previously hourly.  Daytime frequency x 2.  He still has some urinary urgency, which is stable.  He has not had any urinary incontinence.  He has constipation and dry mouth at baseline.  Bladder scan on arrival 83 mL, last void 1 hour prior.  PMH: Past Medical History:  Diagnosis Date   Diabetes mellitus without complication (HCC)    Dysplastic nevus 06/28/2008   Superior back. Slight to moderate atypia.   Dysplastic nevus 06/28/2008   Mid back. Mild atypia   Dysplastic nevus 08/10/2023   right upper back, severe, excision 10/07/23, no residual DN, margins free.   Hyperlipemia    Hypertension    Squamous cell carcinoma of skin 06/28/2008   Sternal notch. KA pattern. Excised 08/17/2008    Surgical History: Past Surgical History:  Procedure Laterality Date   IR KYPHO LUMBAR INC FX REDUCE BONE BX UNI/BIL CANNULATION INC/IMAGING  12/17/2023   IR RADIOLOGIST EVAL & MGMT  12/01/2023   IR RADIOLOGIST EVAL & MGMT  12/31/2023    Home Medications:  Allergies as of 03/28/2024       Reactions   Lipitor [atorvastatin] Other (See Comments)   Myalgias        Medication List        Accurate as of March 28, 2024  3:10 PM. If you have any questions, ask your nurse or doctor.          amLODipine  10 MG tablet Commonly known as: NORVASC  Take 1 tablet (10 mg total) by mouth daily.   FENOFIBRATE PO Take 1 tablet by mouth at bedtime.   FISH OIL PO Take 2 capsules by mouth daily.   gabapentin 300 MG capsule Commonly known as: NEURONTIN Take 900 mg by mouth at bedtime.    Gemtesa  75 MG Tabs Generic drug: Vibegron  Take 1 tablet (75 mg total) by mouth daily at 6 (six) AM.   GLUCOPHAGE PO Take 1 tablet by mouth 2 (two) times daily.   lisinopril 40 MG tablet Commonly known as: ZESTRIL Take 40 mg by mouth daily.   MILK THISTLE PO Take 2 tablets by mouth at bedtime.   mupirocin  ointment 2 % Commonly known as: BACTROBAN  Apply 1 Application topically daily.   omeprazole 20 MG capsule Commonly known as: PRILOSEC Take 20 mg by mouth daily.   OVER THE COUNTER MEDICATION Take 2 capsules by mouth daily. Beet root supplement   tamsulosin 0.4 MG Caps capsule Commonly known as: FLOMAX Take 0.4 mg by mouth daily.   Turmeric Curcumin Caps Take 1-2 capsules by mouth See admin instructions. 2 capsules every morning, 1 capsule at bedtime.   VITAMIN D-3 PO Take 1 capsule by mouth daily.        Allergies:  Allergies  Allergen Reactions   Lipitor [Atorvastatin] Other (See Comments)    Myalgias    Family History: No family history on file.  Social History:   reports that he has never smoked. He has never used smokeless tobacco. He reports that he does not drink alcohol and does not use drugs.  Physical Exam: BP 119/75  Pulse 62   Ht 5' 9 (1.753 m)   Wt 240 lb (108.9 kg)   BMI 35.44 kg/m   Constitutional:  Alert and oriented, no acute distress, nontoxic appearing HEENT: [place], AT Cardiovascular: No clubbing, cyanosis, or edema Respiratory: Normal respiratory effort, no increased work of breathing Skin: No rashes, bruises or suspicious lesions Neurologic: Grossly intact, no focal deficits, moving all 4 extremities Psychiatric: Normal mood and affect  Laboratory Data: Results for orders placed or performed in visit on 03/28/24  BLADDER SCAN AMB NON-IMAGING   Collection Time: 03/28/24  3:08 PM  Result Value Ref Range   Scan Result 83ml    Assessment & Plan:   1. Urinary frequency (Primary) Significant improvement in frequency, urge  incontinence, and nocturia on Gemtesa .  He is emptying appropriately.  I initially sent in a refill of Gemtesa , however switched him to mirabegron 50 mg daily when his pharmacy notified us  that Gemtesa  would not be covered with his insurance.  May consider anticholinergics in the future, would recommend limiting it to long-acting forms given his baseline constipation and dry mouth. - BLADDER SCAN AMB NON-IMAGING   Return in about 1 year (around 03/28/2025) for IPSS/PVR.  Lucie Hones, PA-C  [facility] [place] [address] [place], [place] [address] [phone]

## 2025-03-28 ENCOUNTER — Ambulatory Visit: Admitting: Physician Assistant
